# Patient Record
Sex: Female | Born: 1970 | Race: White | Hispanic: Yes | Marital: Married | Smoking: Never smoker
Health system: Southern US, Community
[De-identification: ages and names within clinical notes are randomized; demographics above are authoritative.]

## PROBLEM LIST (undated history)

## (undated) DIAGNOSIS — I499 Cardiac arrhythmia, unspecified: Secondary | ICD-10-CM

## (undated) DIAGNOSIS — I639 Cerebral infarction, unspecified: Secondary | ICD-10-CM

## (undated) DIAGNOSIS — R569 Unspecified convulsions: Secondary | ICD-10-CM

## (undated) HISTORY — DX: Cardiac arrhythmia, unspecified: I49.9

---

## 1993-12-25 HISTORY — PX: BREAST SURGERY: SHX581

## 2009-06-04 ENCOUNTER — Ambulatory Visit: Payer: Self-pay | Admitting: Diagnostic Radiology

## 2009-06-04 ENCOUNTER — Encounter: Payer: Self-pay | Admitting: Emergency Medicine

## 2009-06-04 ENCOUNTER — Inpatient Hospital Stay (HOSPITAL_COMMUNITY): Admission: EM | Admit: 2009-06-04 | Discharge: 2009-06-08 | Payer: Self-pay | Admitting: Emergency Medicine

## 2009-07-05 ENCOUNTER — Emergency Department (HOSPITAL_COMMUNITY): Admission: EM | Admit: 2009-07-05 | Discharge: 2009-07-05 | Payer: Self-pay | Admitting: Emergency Medicine

## 2010-01-17 ENCOUNTER — Emergency Department (HOSPITAL_BASED_OUTPATIENT_CLINIC_OR_DEPARTMENT_OTHER): Admission: EM | Admit: 2010-01-17 | Discharge: 2010-01-17 | Payer: Self-pay | Admitting: Emergency Medicine

## 2011-03-12 LAB — COMPREHENSIVE METABOLIC PANEL
AST: 23 U/L (ref 0–37)
Albumin: 4.3 g/dL (ref 3.5–5.2)
Alkaline Phosphatase: 86 U/L (ref 39–117)
BUN: 8 mg/dL (ref 6–23)
Chloride: 103 mEq/L (ref 96–112)
Creatinine, Ser: 0.6 mg/dL (ref 0.4–1.2)
GFR calc Af Amer: >60 mL/min (ref >60)
Potassium: 3.4 mEq/L — ABNORMAL LOW (ref 3.5–5.1)
Total Bilirubin: 0.3 mg/dL (ref 0.3–1.2)
Total Protein: 7.5 g/dL (ref 6.0–8.3)

## 2011-03-12 LAB — CBC
HCT: 34.3 % — ABNORMAL LOW (ref 36.0–46.0)
Platelets: 282 10*3/uL (ref 150–400)
RDW: 11.5 % (ref 11.5–15.5)
WBC: 7.1 10*3/uL (ref 4.0–10.5)

## 2011-03-12 LAB — URINALYSIS, ROUTINE W REFLEX MICROSCOPIC
Ketones, ur: NEGATIVE mg/dL
Protein, ur: NEGATIVE mg/dL
Urobilinogen, UA: 0.2 mg/dL (ref 0.0–1.0)

## 2011-03-12 LAB — DIFFERENTIAL
Eosinophils Relative: 1 % (ref 0–5)
Lymphocytes Relative: 20 % (ref 12–46)
Monocytes Absolute: 0.3 10*3/uL (ref 0.1–1.0)
Monocytes Relative: 4 % (ref 3–12)
Neutro Abs: 5.4 10*3/uL (ref 1.7–7.7)

## 2011-03-12 LAB — PREGNANCY, URINE: Preg Test, Ur: NEGATIVE

## 2011-04-02 LAB — URINALYSIS, ROUTINE W REFLEX MICROSCOPIC
Bilirubin Urine: NEGATIVE
Leukocytes, UA: NEGATIVE
Nitrite: NEGATIVE
Specific Gravity, Urine: 1.003 — ABNORMAL LOW (ref 1.005–1.030)
Urobilinogen, UA: 0.2 mg/dL (ref 0.0–1.0)

## 2011-04-02 LAB — COMPREHENSIVE METABOLIC PANEL
AST: 15 U/L (ref 0–37)
BUN: 6 mg/dL (ref 6–23)
CO2: 30 mEq/L (ref 19–32)
Calcium: 9.2 mg/dL (ref 8.4–10.5)
Chloride: 106 mEq/L (ref 96–112)
Creatinine, Ser: 0.69 mg/dL (ref 0.4–1.2)
GFR calc non Af Amer: >60 mL/min (ref >60)
Glucose, Bld: 102 mg/dL — ABNORMAL HIGH (ref 70–99)
Total Bilirubin: 0.3 mg/dL (ref 0.3–1.2)

## 2011-04-02 LAB — URINE MICROSCOPIC-ADD ON

## 2011-04-02 LAB — DIFFERENTIAL
Basophils Absolute: 0 10*3/uL (ref 0.0–0.1)
Eosinophils Relative: 2 % (ref 0–5)
Lymphocytes Relative: 26 % (ref 12–46)
Lymphs Abs: 1.3 10*3/uL (ref 0.7–4.0)
Neutro Abs: 3.4 10*3/uL (ref 1.7–7.7)
Neutrophils Relative %: 66 % (ref 43–77)

## 2011-04-02 LAB — CBC
HCT: 34.2 % — ABNORMAL LOW (ref 36.0–46.0)
Hemoglobin: 11.8 g/dL — ABNORMAL LOW (ref 12.0–15.0)
MCHC: 34.5 g/dL (ref 30.0–36.0)
MCV: 87.4 fL (ref 78.0–100.0)
RBC: 3.91 MIL/uL (ref 3.87–5.11)
WBC: 5.1 10*3/uL (ref 4.0–10.5)

## 2011-04-02 LAB — LIPASE, BLOOD: Lipase: 32 U/L (ref 11–59)

## 2011-04-03 LAB — CBC
HCT: 31.5 % — ABNORMAL LOW (ref 36.0–46.0)
HCT: 38 % (ref 36.0–46.0)
Hemoglobin: 10.6 g/dL — ABNORMAL LOW (ref 12.0–15.0)
Hemoglobin: 10.7 g/dL — ABNORMAL LOW (ref 12.0–15.0)
Hemoglobin: 10.7 g/dL — ABNORMAL LOW (ref 12.0–15.0)
Hemoglobin: 13.6 g/dL (ref 12.0–15.0)
MCHC: 33.8 g/dL (ref 30.0–36.0)
MCHC: 33.9 g/dL (ref 30.0–36.0)
MCHC: 34.1 g/dL (ref 30.0–36.0)
MCV: 87.8 fL (ref 78.0–100.0)
MCV: 88.7 fL (ref 78.0–100.0)
Platelets: 250 10*3/uL (ref 150–400)
Platelets: 265 10*3/uL (ref 150–400)
RBC: 3.6 MIL/uL — ABNORMAL LOW (ref 3.87–5.11)
RBC: 4.61 MIL/uL (ref 3.87–5.11)
RDW: 12.5 % (ref 11.5–15.5)
RDW: 12.5 % (ref 11.5–15.5)
RDW: 12.6 % (ref 11.5–15.5)
RDW: 12.6 % (ref 11.5–15.5)
WBC: 11.3 10*3/uL — ABNORMAL HIGH (ref 4.0–10.5)

## 2011-04-03 LAB — COMPREHENSIVE METABOLIC PANEL
ALT: 14 U/L (ref 0–35)
Alkaline Phosphatase: 72 U/L (ref 39–117)
CO2: 26 mEq/L (ref 19–32)
GFR calc non Af Amer: >60 mL/min (ref >60)
Glucose, Bld: 116 mg/dL — ABNORMAL HIGH (ref 70–99)
Potassium: 3.9 mEq/L (ref 3.5–5.1)
Sodium: 139 mEq/L (ref 135–145)
Total Bilirubin: 0.8 mg/dL (ref 0.3–1.2)

## 2011-04-03 LAB — DIFFERENTIAL
Basophils Relative: 0 % (ref 0–1)
Basophils Relative: 3 % — ABNORMAL HIGH (ref 0–1)
Eosinophils Absolute: 0 10*3/uL (ref 0.0–0.7)
Lymphocytes Relative: 20 % (ref 12–46)
Lymphs Abs: 2.3 10*3/uL (ref 0.7–4.0)
Monocytes Absolute: 0.5 10*3/uL (ref 0.1–1.0)
Monocytes Absolute: 0.7 10*3/uL (ref 0.1–1.0)
Monocytes Relative: 4 % (ref 3–12)
Neutro Abs: 8.3 10*3/uL — ABNORMAL HIGH (ref 1.7–7.7)
Neutrophils Relative %: 85 % — ABNORMAL HIGH (ref 43–77)

## 2011-04-03 LAB — URINALYSIS, ROUTINE W REFLEX MICROSCOPIC
Bilirubin Urine: NEGATIVE
Glucose, UA: NEGATIVE mg/dL
Nitrite: NEGATIVE
Specific Gravity, Urine: 1.028 (ref 1.005–1.030)
pH: 5.5 (ref 5.0–8.0)

## 2011-04-03 LAB — BASIC METABOLIC PANEL
Calcium: 8.1 mg/dL — ABNORMAL LOW (ref 8.4–10.5)
GFR calc Af Amer: >60 mL/min (ref >60)
GFR calc non Af Amer: >60 mL/min (ref >60)
Glucose, Bld: 72 mg/dL (ref 70–99)
Sodium: 138 mEq/L (ref 135–145)

## 2011-04-03 LAB — PREGNANCY, URINE: Preg Test, Ur: NEGATIVE

## 2011-04-03 LAB — AMYLASE: Amylase: 70 U/L (ref 27–131)

## 2011-04-03 LAB — LACTIC ACID, PLASMA: Lactic Acid, Venous: 0.8 mmol/L (ref 0.5–2.2)

## 2011-04-03 LAB — PROTIME-INR: Prothrombin Time: 13.2 seconds (ref 11.6–15.2)

## 2011-05-09 NOTE — Discharge Summary (Signed)
[name], [name] NO.:  [id number]   MEDICAL RECORD NO.:  [id number]          PATIENT TYPE:  INP   LOCATION:  5159                         FACILITY:  [facility]   PHYSICIAN:  Sandria Bales. Ezzard Standing, M.D.  DATE OF BIRTH:  [birth year]   DATE OF ADMISSION:  06/04/2009  DATE OF DISCHARGE:  06/08/2009                               DISCHARGE SUMMARY   ADMITTING PHYSICIAN:  Almond Lint, MD   DISCHARGING PHYSICIAN:  Sandria Bales. Ezzard Standing, MD   CONSULTANTS:  None.   PROCEDURES:  None.   REASON FOR ADMISSION:  [name] is a 40 year old white female with  no significant past medical history except for recent obstipation.  Because of her obstipation, she went to the health center the day prior  to admission and received a colonic.  After this, the patient felt faint  and immediately began having diarrhea with bright red blood per rectum.  She never had any abdominal pain, but due to continued diarrhea with  blood in it, she presented to the emergency department.  The CT scan at  this time showed portal venous gas with colonic wall thickening and a  small amount of free air along with extensive pneumatosis.  Her white  blood cell count at this time was 17,000 and on exam,  her abdomen was  soft, nontender, and nondistended with active bowel sounds, otherwise  unremarkable.  Please see admitting history and physical for further  details.   ADMITTING DIAGNOSES:  1. Bright red blood per rectum after colonic enema.  2. Pneumatosis and pneumoperitoneum per CT scan.  3. Leukocytosis.  4. Recent obstipation.   HOSPITAL COURSE:  At this time, the patient was admitted and started on  Zosyn.  She was kept under close observation with repeat films ordered  for 11 a.m. with repeat lab work.  Repeat x-rays did not show any  pneumoperitoneum and showed that the patient's portal venous gas had  dissipated.  Her white count was stable, and the patient was otherwise  nontender.  Over the  next several days, she was followed conservatively.  Her diarrhea began to improve as well as there was decrease in the  amount of blood in her diarrhea.   By hospital day 3, the patient's diet was advanced and by hospital day  4, the patient was tolerating regular diet.  However, by postoperative  day 4, the patient felt like she was not ready to go home and wanted  repeat x-rays to prove that nothing else was going on.  At this time,  her white count was normal at 8500 and a repeat KUB was obtained which  showed normal bowel gas pattern and no other issues.  By June 08, 2009,  the patient had no complaint, and at this point was ready for discharge  home.  On exam, her abdomen was soft, nontender, and nondistended with  active bowel sounds.   DISCHARGE DIAGNOSES:  1. Pneumatosis and pneumoperitoneum of the colon per CT scan after      colonic enema.  This was felt to be related to the amount of  pressure associated with this.  This had resolved by discharge.  2. Bright red blood per rectum.  This had resolved by discharge as      well.  3. Leukocytosis, resolved.  4. Obstipation.   DISCHARGE MEDICATIONS:  1. She does not take any home medications.  She was given a      prescription for Augmentin 875 mg 1 p.o. b.i.d. x5 days.  2. MiraLax or Dulcolax or any over-the-counter stool softeners as      needed for constipation.   DISCHARGE INSTRUCTIONS:  The patient is informed that she has no dietary  restrictions.  She has no activity restrictions and is to return to see her primary  care doctor as needed for followup.      Nancy Cape, PA      Sandria Bales. Ezzard Standing, M.D.  Electronically Signed    KEO/MEDQ  D:  06/08/2009  T:  06/08/2009  Job:  [id number]

## 2011-05-09 NOTE — H&P (Signed)
NAMEMAGDALENE, TARDIFF            ACCOUNT NO.:  [id number]   MEDICAL RECORD NO.:  [id number]          PATIENT TYPE:  INP   LOCATION:  5159                         FACILITY:  [facility]   PHYSICIAN:  Almond Lint, MD       DATE OF BIRTH:  1971/05/31   DATE OF ADMISSION:  06/04/2009  DATE OF DISCHARGE:                              HISTORY & PHYSICAL   TIME OF ADMISSION:  7:55 a.m.   PRIMARY CARE PHYSICIAN:  She does not have one.   ADMITTING PHYSICIAN:  Almond Lint, MD   CHIEF COMPLAINT:  Bright red blood per rectum.   HISTORY OF PRESENT ILLNESS:  Ms. Winkles is a 40 year old white female  with no significant past medical history except for recent obstipation.  She recently moved down here from [place] approximately 15 to 18 months  ago.  Due to her obstipation, she went to a health center yesterday and  received a colonic enema around 2 p.m.  As soon as this was  administered, the patient had a strange feeling and then began having  diarrhea that was stained with blood.  She states that she has never  once had abdominal pain, but she does describe multiple bouts of emesis.  Because of this bleeding and emesis, she went to [facility]  Emergency Department this morning around 1 a.m.  At this time, an NG  tube was placed and a CT scan of the abdomen and pelvis was obtained,  which showed portal venous gas along with colonic wall thickening of the  entire colon and a small amount of free air.  She also had labs done,  which showed a white blood cell count of 17,000.  At this time, she was  transferred to [facility] for admission by the surgical service.   REVIEW OF SYSTEMS:  Please see HPI, otherwise all other systems are  negative.   PAST MEDICAL HISTORY:  1. She states she has a history of an abnormal heart rhythm, but does      not remember the details and does not take any medicines for it.  2. Recent obstipation.   PAST SURGICAL HISTORY:  There is none.   FAMILY  HISTORY:  The patient admits to her grandmother and aunt having  history of breast cancer.  There are no colonic cancers or any other  colon pathology.   SOCIAL HISTORY:  The patient is married and has given birth to 1 child.  However, there are total of 4 children in the household.  She does not  smoke and does not drink any alcohol.   ALLERGIES:  NKDA.   MEDICATIONS:  None.   PHYSICAL EXAMINATION:  GENERAL:  Ms. Molchan is a 39 year old well-  developed, well-nourished white female who is currently sitting up in a  stretcher, in no acute distress.  VITAL SIGNS:  Temperature 97.6, pulse 90, respirations 19, and blood  pressure 111/65.  HEENT:  Head is normocephalic and atraumatic.  Sclerae noninjected.  Pupils are equal, round, and reactive to light.  Ears and nose without  any obvious masses or lesions.  No  rhinorrhea, however, she does have an  NG tube present.  Mouth is pink, but somewhat dry.  Throat shows no  exudate.  NECK:  Supple.  Trachea is midline.  No thyromegaly.  HEART:  Regular rate and rhythm.  Normal S1 and S2.  No murmurs,  gallops, or rubs are noted.  +2 carotid, radial, and pedal pulses  bilaterally.  LUNGS:  Clear to auscultation bilaterally with no wheezes, rhonchi, or  rales noted.  Respiratory effort is nonlabored.  ABDOMEN:  Soft, nontender, nondistended with active bowel sounds.  She  has no peritoneal signs and no masses or hernias were noted.  RECTAL:  Deferred at this time, however, the patient is stool guaiac  positive.  MUSCULOSKELETAL:  All 4 extremities are symmetrical.  No cyanosis,  clubbing, or edema.  SKIN:  Warm and dry.  NEUROLOGIC:  Cranial nerves II through XII appear to be grossly intact.  Deep tendon reflex exam is deferred at this time.  PSYCHIATRIC:  The patient is alert and oriented x3 with an appropriate  affect.   LABORATORY DATA AND DIAGNOSTICS:  White blood cell count 16,900,  hemoglobin 13.6, hematocrit 40.6, platelet  count is 295,000, and  neutrophils count are 85%.  Lactic acid level is 0.8, sodium 139,  potassium 3.9, glucose 116, BUN 8, creatinine 0.69.  CT scan of the  abdomen and pelvis stated in HPI shows some portal venous gas along with  colonic wall thickening throughout the ascending transverse and  descending colon.  However, it does seem that the sigmoid colon is  spared and decompressed.  It does shows small amount of free air, but  with significant pneumatosis.   IMPRESSION:  1. Bright red blood per rectum after colonic enema.  2. Pneumatosis and pneumoperitoneum per CT scan.  3. Leukocytosis.  4. Recent obstipation.   PLAN:  Ms. Mielke clinically does not appear as sick or toxic as her  CT scan states.  Therefore, since she is currently stable, we will admit  her and keep her for observation.  We will obtain an acute abdominal  series along with some repeat labs early this morning.  If her acute  abdominal series continues to show concern for free air, the patient may  need a diagnostic laparoscopy or laparotomy.  In the meantime, she will  be started on Zosyn as well as IV fluids at 200 mL an hour.  She will be  given various other p.r.n. medications for nausea.  She will be kept  n.p.o. and her NG tube will be held to low intermittent wall suction.      Letha Cape, PA      Almond Lint, MD  Electronically Signed   KEO/MEDQ  D:  06/04/2009  T:  06/04/2009  Job:  [id number]

## 2013-06-11 ENCOUNTER — Emergency Department (HOSPITAL_BASED_OUTPATIENT_CLINIC_OR_DEPARTMENT_OTHER)
Admission: EM | Admit: 2013-06-11 | Discharge: 2013-06-11 | Disposition: A | Discharge status: Home / Self Care | Payer: Managed Care, Other (non HMO) | Attending: Emergency Medicine | Admitting: Emergency Medicine

## 2013-06-11 ENCOUNTER — Encounter (HOSPITAL_BASED_OUTPATIENT_CLINIC_OR_DEPARTMENT_OTHER): Payer: Self-pay | Admitting: *Deleted

## 2013-06-11 DIAGNOSIS — R5381 Other malaise: Secondary | ICD-10-CM | POA: Insufficient documentation

## 2013-06-11 DIAGNOSIS — R42 Dizziness and giddiness: Secondary | ICD-10-CM | POA: Insufficient documentation

## 2013-06-11 DIAGNOSIS — R197 Diarrhea, unspecified: Secondary | ICD-10-CM | POA: Insufficient documentation

## 2013-06-11 DIAGNOSIS — K5289 Other specified noninfective gastroenteritis and colitis: Secondary | ICD-10-CM | POA: Insufficient documentation

## 2013-06-11 DIAGNOSIS — Z3202 Encounter for pregnancy test, result negative: Secondary | ICD-10-CM | POA: Insufficient documentation

## 2013-06-11 DIAGNOSIS — K529 Noninfective gastroenteritis and colitis, unspecified: Secondary | ICD-10-CM

## 2013-06-11 DIAGNOSIS — E860 Dehydration: Secondary | ICD-10-CM | POA: Insufficient documentation

## 2013-06-11 DIAGNOSIS — R6883 Chills (without fever): Secondary | ICD-10-CM | POA: Insufficient documentation

## 2013-06-11 LAB — COMPREHENSIVE METABOLIC PANEL
AST: 17 U/L (ref 0–37)
Albumin: 4.2 g/dL (ref 3.5–5.2)
BUN: 8 mg/dL (ref 6–23)
Chloride: 103 mEq/L (ref 96–112)
Creatinine, Ser: 0.7 mg/dL (ref 0.50–1.10)
Total Bilirubin: 0.5 mg/dL (ref 0.3–1.2)
Total Protein: 7.6 g/dL (ref 6.0–8.3)

## 2013-06-11 LAB — CBC WITH DIFFERENTIAL/PLATELET
Basophils Absolute: 0 10*3/uL (ref 0.0–0.1)
Basophils Relative: 0 % (ref 0–1)
Eosinophils Absolute: 0 10*3/uL (ref 0.0–0.7)
MCH: 28.7 pg (ref 26.0–34.0)
MCHC: 33.8 g/dL (ref 30.0–36.0)
Monocytes Absolute: 0.5 10*3/uL (ref 0.1–1.0)
Monocytes Relative: 7 % (ref 3–12)
Neutro Abs: 4.3 10*3/uL (ref 1.7–7.7)
Neutrophils Relative %: 62 % (ref 43–77)
RDW: 12.9 % (ref 11.5–15.5)

## 2013-06-11 LAB — URINALYSIS, ROUTINE W REFLEX MICROSCOPIC
Bilirubin Urine: NEGATIVE
Ketones, ur: NEGATIVE mg/dL
Leukocytes, UA: NEGATIVE
Nitrite: NEGATIVE
Protein, ur: NEGATIVE mg/dL

## 2013-06-11 LAB — PREGNANCY, URINE: Preg Test, Ur: NEGATIVE

## 2013-06-11 MED ORDER — KETOROLAC TROMETHAMINE 30 MG/ML IJ SOLN
30.0000 mg | Freq: Once | INTRAMUSCULAR | Status: DC
  Filled 2013-06-11: qty 1

## 2013-06-11 MED ORDER — SODIUM CHLORIDE 0.9 % IV BOLUS (SEPSIS)
1000.0000 mL | Freq: Once | INTRAVENOUS | Status: AC
  Administered 2013-06-11: 1000 mL via INTRAVENOUS

## 2013-06-11 MED ORDER — ONDANSETRON HCL 4 MG/2ML IJ SOLN
4.0000 mg | Freq: Once | INTRAMUSCULAR | Status: DC
  Filled 2013-06-11: qty 2

## 2013-06-11 NOTE — ED Notes (Signed)
Was on treadmill at the gym and after 50 minutes got chills and started having diarrhea and nausea

## 2013-06-11 NOTE — ED Provider Notes (Signed)
History     CSN: [id number]  Arrival date & time 06/11/13  1148   First MD Initiated Contact with Patient 06/11/13 1222      Chief Complaint  Patient presents with  . possible dehydration     (Consider location/radiation/quality/duration/timing/severity/associated sxs/prior treatment) HPI Comments: Patient was running on the treadmill yesterday.  When she got off the equipment, she started with n/v/d that has persisted into today.  She feels weak and dizzy and believes she is dehydrated.    Patient is a 42 y.o. female presenting with diarrhea. The history is provided by the patient.  Diarrhea Quality:  Watery Severity:  Moderate Onset quality:  Sudden Duration:  24 hours Timing:  Constant Progression:  Unchanged Relieved by:  Nothing Worsened by:  Nothing tried Ineffective treatments:  None tried Associated symptoms: chills and vomiting   Associated symptoms: no abdominal pain and no fever     History reviewed. No pertinent past medical history.  History reviewed. No pertinent past surgical history.  History reviewed. No pertinent family history.  History  Substance Use Topics  . Smoking status: Never Smoker   . Smokeless tobacco: Not on file  . Alcohol Use: No    OB History   Grav Para Term Preterm Abortions TAB SAB Ect Mult Living                  Review of Systems  Constitutional: Positive for chills and fatigue. Negative for fever.  Gastrointestinal: Positive for vomiting and diarrhea. Negative for abdominal pain.  All other systems reviewed and are negative.    Allergies  Review of patient's allergies indicates no known allergies.  Home Medications  No current outpatient prescriptions on file.  LMP 05/21/2013  Physical Exam  Nursing note and vitals reviewed. Constitutional: She is oriented to person, place, and time. She appears well-developed and well-nourished. No distress.  HENT:  Head: Normocephalic and atraumatic.  Mouth/Throat:  Oropharynx is clear and moist.  Neck: Normal range of motion. Neck supple.  Cardiovascular: Normal rate and regular rhythm.  Exam reveals no gallop and no friction rub.   No murmur heard. Pulmonary/Chest: Effort normal and breath sounds normal. No respiratory distress. She has no wheezes.  Abdominal: Soft. Bowel sounds are normal. She exhibits no distension. There is no tenderness.  Musculoskeletal: Normal range of motion.  Neurological: She is alert and oriented to person, place, and time.  Skin: Skin is warm and dry. She is not diaphoretic.    ED Course  Procedures (including critical care time)  Labs Reviewed  PREGNANCY, URINE  URINALYSIS, ROUTINE W REFLEX MICROSCOPIC  CBC WITH DIFFERENTIAL  COMPREHENSIVE METABOLIC PANEL   No results found.   No diagnosis found.    MDM  Fluids given and she feels better.  Labs okay.  Will discharge to home, return prn.        Geoffery Lyons, MD 06/11/13 [phone]

## 2013-06-11 NOTE — ED Notes (Signed)
Pt declined Zofran and Toradol.

## 2014-11-10 ENCOUNTER — Encounter (HOSPITAL_BASED_OUTPATIENT_CLINIC_OR_DEPARTMENT_OTHER): Payer: Self-pay | Admitting: Emergency Medicine

## 2014-11-10 DIAGNOSIS — R197 Diarrhea, unspecified: Secondary | ICD-10-CM | POA: Diagnosis not present

## 2014-11-10 DIAGNOSIS — R112 Nausea with vomiting, unspecified: Secondary | ICD-10-CM | POA: Insufficient documentation

## 2014-11-10 DIAGNOSIS — Z3202 Encounter for pregnancy test, result negative: Secondary | ICD-10-CM | POA: Diagnosis not present

## 2014-11-10 NOTE — ED Notes (Signed)
Pt states she took a B12 pill tonight, her first time taking it, and started vomiting after.

## 2014-11-11 ENCOUNTER — Emergency Department (HOSPITAL_BASED_OUTPATIENT_CLINIC_OR_DEPARTMENT_OTHER)
Admission: EM | Admit: 2014-11-11 | Discharge: 2014-11-11 | Disposition: A | Discharge status: Home / Self Care | Payer: Managed Care, Other (non HMO) | Attending: Emergency Medicine | Admitting: Emergency Medicine

## 2014-11-11 DIAGNOSIS — R112 Nausea with vomiting, unspecified: Secondary | ICD-10-CM

## 2014-11-11 DIAGNOSIS — R197 Diarrhea, unspecified: Secondary | ICD-10-CM

## 2014-11-11 LAB — URINE MICROSCOPIC-ADD ON

## 2014-11-11 LAB — PREGNANCY, URINE: Preg Test, Ur: NEGATIVE

## 2014-11-11 LAB — URINALYSIS, ROUTINE W REFLEX MICROSCOPIC
BILIRUBIN URINE: NEGATIVE
Glucose, UA: NEGATIVE mg/dL
KETONES UR: NEGATIVE mg/dL
Leukocytes, UA: NEGATIVE
NITRITE: NEGATIVE
PROTEIN: NEGATIVE mg/dL
Specific Gravity, Urine: 1.028 (ref 1.005–1.030)
UROBILINOGEN UA: 0.2 mg/dL (ref 0.0–1.0)
pH: 5.5 (ref 5.0–8.0)

## 2014-11-11 MED ORDER — DIPHENOXYLATE-ATROPINE 2.5-0.025 MG PO TABS: 1.0000 | ORAL_TABLET | Freq: Four times a day (QID) | ORAL | Status: DC | PRN

## 2014-11-11 MED ORDER — DIPHENOXYLATE-ATROPINE 2.5-0.025 MG PO TABS
2.0000 | ORAL_TABLET | Freq: Once | ORAL | Status: AC
Start: 2014-11-11 — End: 2014-11-11
  Administered 2014-11-11: 2 via ORAL
  Filled 2014-11-11: qty 2

## 2014-11-11 MED ORDER — ONDANSETRON 4 MG PO TBDP
4.0000 mg | ORAL_TABLET | Freq: Once | ORAL | Status: AC
  Administered 2014-11-11: 4 mg via ORAL
  Filled 2014-11-11: qty 1

## 2014-11-11 MED ORDER — SODIUM CHLORIDE 0.9 % IV BOLUS (SEPSIS)
1000.0000 mL | Freq: Once | INTRAVENOUS | Status: AC
  Administered 2014-11-11: 1000 mL via INTRAVENOUS

## 2014-11-11 MED ORDER — ONDANSETRON HCL 4 MG PO TABS: 4.0000 mg | ORAL_TABLET | Freq: Three times a day (TID) | ORAL | Status: DC | PRN

## 2014-11-11 NOTE — ED Provider Notes (Signed)
CSN: [id number]     Arrival date & time 11/10/14  2320 History   First MD Initiated Contact with Patient 11/11/14 0108     Chief Complaint  Patient presents with  . Vomiting      (Consider location/radiation/quality/duration/timing/severity/associated sxs/prior Treatment) HPI This is a 43 year old female who developed nausea, vomiting and diarrhea yesterday afternoon. The onset was sudden. The vomiting was profuse but later improved. She continues to have profuse watery diarrhea. She denies abdominal pain or fever. She denies lightheadedness. She was given Zofran on arrival with improvement in her nausea.  History reviewed. No pertinent past medical history. Past Surgical History  Procedure Laterality Date  . Breast surgery     History reviewed. No pertinent family history. History  Substance Use Topics  . Smoking status: Never Smoker   . Smokeless tobacco: Not on file  . Alcohol Use: No   OB History    No data available     Review of Systems  All other systems reviewed and are negative.   Allergies  Review of patient's allergies indicates no known allergies.  Home Medications   Prior to Admission medications   Medication Sig Start Date End Date Taking? Authorizing Provider  Vitamin D, Ergocalciferol, (DRISDOL) 50000 UNITS CAPS capsule Take 50,000 Units by mouth every 7 (seven) days.   Yes Historical Provider, MD   BP 120/68 mmHg  Pulse 103  Temp(Src) 99.1 F (37.3 C) (Oral)  Resp 18  Ht 5\' 4"  (1.626 m)  Wt 132 lb (59.875 kg)  BMI 22.65 kg/m2  SpO2 97%  LMP 11/08/2014   Physical Exam  General: Well-developed, well-nourished female in no acute distress; appearance consistent with age of record HENT: normocephalic; atraumatic Eyes: pupils equal, round and reactive to light; extraocular muscles intact Neck: supple Heart: regular rate and rhythm; tachycardia Lungs: clear to auscultation bilaterally Abdomen: soft; nondistended; nontender; no masses or  hepatosplenomegaly; bowel sounds present Extremities: No deformity; full range of motion; pulses normal Neurologic: Awake, alert and oriented; motor function intact in all extremities and symmetric; no facial droop Skin: Warm and dry Psychiatric: Normal mood and affect    ED Course  Procedures (including critical care time)    MDM  Nursing notes and vitals signs, including pulse oximetry, reviewed.  Summary of this visit's results, reviewed by myself:  Labs:  Results for orders placed or performed during the hospital encounter of 11/11/14 (from the past 24 hour(s))  Urinalysis, Routine w reflex microscopic     Status: Abnormal   Collection Time: 11/11/14  1:40 AM  Result Value Ref Range   Color, Urine YELLOW YELLOW   APPearance CLEAR CLEAR   Specific Gravity, Urine 1.028 1.005 - 1.030   pH 5.5 5.0 - 8.0   Glucose, UA NEGATIVE NEGATIVE mg/dL   Hgb urine dipstick TRACE (A) NEGATIVE   Bilirubin Urine NEGATIVE NEGATIVE   Ketones, ur NEGATIVE NEGATIVE mg/dL   Protein, ur NEGATIVE NEGATIVE mg/dL   Urobilinogen, UA 0.2 0.0 - 1.0 mg/dL   Nitrite NEGATIVE NEGATIVE   Leukocytes, UA NEGATIVE NEGATIVE  Pregnancy, urine     Status: None   Collection Time: 11/11/14  1:40 AM  Result Value Ref Range   Preg Test, Ur NEGATIVE NEGATIVE  Urine microscopic-add on     Status: None   Collection Time: 11/11/14  1:40 AM  Result Value Ref Range   Squamous Epithelial / LPF RARE RARE   WBC, UA 0-2 <3 WBC/hpf   RBC / HPF 0-2 <3 RBC/hpf  Bacteria, UA RARE RARE   Urine-Other MUCOUS PRESENT    2:38 AM Drinking fluids without emesis.   Hanley SeamenJohn L Elayjah Chaney, MD 11/11/14 [phone]

## 2016-10-24 ENCOUNTER — Encounter (HOSPITAL_BASED_OUTPATIENT_CLINIC_OR_DEPARTMENT_OTHER): Payer: Self-pay

## 2016-10-24 ENCOUNTER — Emergency Department (HOSPITAL_BASED_OUTPATIENT_CLINIC_OR_DEPARTMENT_OTHER)
Admission: EM | Admit: 2016-10-24 | Discharge: 2016-10-24 | Disposition: A | Discharge status: Home / Self Care | Payer: Managed Care, Other (non HMO) | Attending: Physician Assistant | Admitting: Physician Assistant

## 2016-10-24 ENCOUNTER — Emergency Department (HOSPITAL_BASED_OUTPATIENT_CLINIC_OR_DEPARTMENT_OTHER): Payer: Managed Care, Other (non HMO)

## 2016-10-24 DIAGNOSIS — R05 Cough: Secondary | ICD-10-CM | POA: Insufficient documentation

## 2016-10-24 DIAGNOSIS — J3489 Other specified disorders of nose and nasal sinuses: Secondary | ICD-10-CM | POA: Insufficient documentation

## 2016-10-24 DIAGNOSIS — R0981 Nasal congestion: Secondary | ICD-10-CM | POA: Insufficient documentation

## 2016-10-24 DIAGNOSIS — R52 Pain, unspecified: Secondary | ICD-10-CM | POA: Diagnosis present

## 2016-10-24 DIAGNOSIS — R5383 Other fatigue: Secondary | ICD-10-CM | POA: Diagnosis not present

## 2016-10-24 DIAGNOSIS — R6889 Other general symptoms and signs: Secondary | ICD-10-CM

## 2016-10-24 DIAGNOSIS — M791 Myalgia: Secondary | ICD-10-CM | POA: Diagnosis not present

## 2016-10-24 MED ORDER — BENZONATATE 100 MG PO CAPS: 100.0000 mg | ORAL_CAPSULE | Freq: Three times a day (TID) | ORAL | 0 refills | Status: DC | PRN

## 2016-10-24 MED ORDER — ALBUTEROL SULFATE HFA 108 (90 BASE) MCG/ACT IN AERS
2.0000 | INHALATION_SPRAY | Freq: Once | RESPIRATORY_TRACT | Status: AC
  Administered 2016-10-24: 2 via RESPIRATORY_TRACT
  Filled 2016-10-24: qty 6.7

## 2016-10-24 MED ORDER — GUAIFENESIN-CODEINE 100-10 MG/5ML PO SOLN: 5.0000 mL | Freq: Four times a day (QID) | ORAL | 0 refills | Status: DC | PRN

## 2016-10-24 MED ORDER — IBUPROFEN 800 MG PO TABS: 800.0000 mg | ORAL_TABLET | Freq: Three times a day (TID) | ORAL | 0 refills | Status: DC

## 2016-10-24 MED ORDER — IBUPROFEN 800 MG PO TABS
800.0000 mg | ORAL_TABLET | Freq: Once | ORAL | Status: DC
  Filled 2016-10-24: qty 1

## 2016-10-24 MED ORDER — GUAIFENESIN 100 MG/5ML PO SOLN
5.0000 mL | Freq: Once | ORAL | Status: AC
  Administered 2016-10-24: 100 mg via ORAL
  Filled 2016-10-24: qty 5

## 2016-10-24 NOTE — ED Provider Notes (Signed)
[facility] Note   CSN: [id number] Arrival date & time: 10/24/16  1527     History   Chief Complaint Chief Complaint  Patient presents with  . Generalized Body Aches    HPI [name] is a 45 y.o. female.   URI   This is a new problem. The current episode started 2 days ago. The problem has been gradually worsening. There has been no fever. The fever has been present for less than 1 day. Associated symptoms include congestion, rhinorrhea and cough. Pertinent negatives include no chest pain, no abdominal pain, no sneezing, no sore throat and no wheezing. She has tried increased fluids and other medications for the symptoms. The treatment provided no relief.    History reviewed. No pertinent past medical history.  There are no active problems to display for this patient.   Past Surgical History:  Procedure Laterality Date  . BREAST SURGERY      OB History    No data available       Home Medications    Prior to Admission medications   Medication Sig Start Date End Date Taking? Authorizing Provider  Vitamin D, Ergocalciferol, (DRISDOL) 50000 UNITS CAPS capsule Take 50,000 Units by mouth every 7 (seven) days.    Historical Provider, MD    Family History No family history on file.  Social History Social History  Substance Use Topics  . Smoking status: Never Smoker  . Smokeless tobacco: Never Used  . Alcohol use No     Allergies   Review of patient's allergies indicates no known allergies.   Review of Systems Review of Systems  Constitutional: Positive for activity change and fatigue.  HENT: Positive for congestion and rhinorrhea. Negative for sneezing and sore throat.   Respiratory: Positive for cough. Negative for wheezing.   Cardiovascular: Negative for chest pain.  Gastrointestinal: Negative for abdominal pain.  Musculoskeletal: Positive for myalgias.  All other systems reviewed and are negative.    Physical  Exam Updated Vital Signs BP 130/75 (BP Location: Left Arm)   Pulse 96   Temp 97.6 F (36.4 C) (Oral)   Resp 18   Ht 5\' 4"  (1.626 m)   Wt 143 lb (64.9 kg)   LMP 10/23/2016   SpO2 100%   BMI 24.55 kg/m   Physical Exam  Constitutional: She is oriented to person, place, and time. She appears well-developed and well-nourished.  HENT:  Head: Normocephalic and atraumatic.  Right Ear: External ear normal.  Left Ear: External ear normal.  Mouth/Throat: Oropharynx is clear and moist. No oropharyngeal exudate.  Eyes: Conjunctivae and EOM are normal. Pupils are equal, round, and reactive to light. Right eye exhibits no discharge. Left eye exhibits no discharge.  Cardiovascular: Normal rate, regular rhythm and normal heart sounds.   No murmur heard. Pulmonary/Chest: Effort normal and breath sounds normal. She has no wheezes. She has no rales.  Abdominal: Soft. She exhibits no distension. There is no tenderness.  Neurological: She is oriented to person, place, and time.  Skin: Skin is warm and dry. She is not diaphoretic.  Psychiatric: She has a normal mood and affect.  Nursing note and vitals reviewed.    ED Treatments / Results  Labs (all labs ordered are listed, but only abnormal results are displayed) Labs Reviewed - No data to display  EKG  EKG Interpretation None       Radiology Dg Chest 2 View  Result Date: 10/24/2016 CLINICAL DATA:  Cough and congestion for  4 days EXAM: CHEST  2 VIEW COMPARISON:  09/25/2013 FINDINGS: Cardiac shadow is within normal limits. The lungs are well aerated bilaterally. No focal infiltrate or sizable effusion is seen. No acute bony abnormality is noted. IMPRESSION: No active cardiopulmonary disease. Electronically Signed   By: Alcide CleverMark  Lukens M.D.   On: 10/24/2016 16:00    Procedures Procedures (including critical care time)  Medications Ordered in ED Medications - No data to display   Initial Impression / Assessment and Plan / ED Course  I  have reviewed the triage vital signs and the nursing notes.  Pertinent labs & imaging results that were available during my care of the patient were reviewed by me and considered in my medical decision making (see chart for details).  Clinical Course    Patient is a 45 year old female presenting with upper respiratory symptoms. This started on Friday she had body aches, fatigue, subjective fevers with a runny nose and cough. Patient reports the cough is getting worse over the last 3 days. The body aches which resolved however the cough has worsened. Patient reported shortness of breath prior to arrival. Patient still has rhinorrhea.   We'll get chest x-ray. We'll treat symptomatically.   Final Clinical Impressions(s) / ED Diagnoses   Final diagnoses:  None    New Prescriptions New Prescriptions   No medications on file     Courteney Randall AnLyn Mackuen, MD 10/24/16 2256

## 2016-10-24 NOTE — ED Triage Notes (Addendum)
C/o body aches, cough, head and chest congestion "feel like a have the flu" since Friday-c/o SOB started 30 min PTA-NAD-steady gait-no flu shot

## 2016-10-24 NOTE — ED Notes (Signed)
Patient is alert and oriented x4.  She is complaining of generalized body aches with a productive cough of white sputum.  Patient adds that she feels chest tightness with congestion.

## 2016-10-24 NOTE — ED Notes (Signed)
Patient is alert and oriented x3.  She was given DC instructions and follow up visit instructions.  Patient gave verbal understanding. She was DC ambulatory under her own power to home.  V/S stable.  He was not showing any signs of distress on DC 

## 2016-10-24 NOTE — Discharge Instructions (Signed)
Please use medications provided to help you with your symptoms. Please return with any concerns.

## 2021-07-24 DIAGNOSIS — I639 Cerebral infarction, unspecified: Secondary | ICD-10-CM

## 2021-07-24 HISTORY — DX: Cerebral infarction, unspecified: I63.9

## 2021-08-02 NOTE — Progress Notes (Signed)
 [facility]  Neurology [address] [place], [place] [address] Ph: [phone]  Fax: [phone]   [name]  05/07/1971  08/02/2021  Chief Complaint: TIA       HPI:  [name] is a 50 y.o. year old right-handed female with PMH of:  - TIA, on ASA 81 mg daily & Plavix 75 mg daily - HLD, on Lipitor 40 mg daily - Palpitations - Vitamin D  deficiency - Iron deficiency anemia  Patient presents to clinic today (08/02/2021) for evaluation of TIA.   [name] was at her normal neurological baseline until 07/23/2021. She states that she was driving back from vacation in [place] , when she experienced sudden right arm and leg tingling followed by right face tingling and slurred speech. She pulled into a rest stop and called EMS. She was taken to [facility], in [place], [place] . She states that she had several scans, (CT head x 4, and MRI brain x 1), and she was told that there was no evidence of infarct. She was also found to have COVID infection. She was treated with tPA. She was started on ASA, Plavix, and Lipitor and discharged with plan to follow up with Neurology as an outpatient.   She states that since discharge, she has had a feeling of pressure in the head without headache. She has some pain in the left eye lasting a few seconds, 2-3 times per week. She denies persistent changes in vision. She endorses mild right arm and leg weakness with paresthesia. She has occasional tingling that may occur in the left and right ear, left foot, and BL hands.   Prior imaging: None available for review  Review of Systems  Constitutional: Positive for fatigue. Negative for chills and fever.  HENT: Negative for tinnitus and trouble swallowing.   Eyes: Positive for pain. Negative for visual disturbance.  Respiratory: Negative for cough and shortness of breath.   Cardiovascular: Positive for palpitations. Negative for  chest pain.  Gastrointestinal: Positive for nausea. Negative for diarrhea and vomiting.  Genitourinary: Negative for difficulty urinating and hematuria.  Musculoskeletal: Positive for back pain and neck pain.  Skin: Negative for rash.  Neurological: Positive for dizziness, weakness and numbness. Negative for speech difficulty.  Psychiatric/Behavioral: Negative for dysphoric mood. The patient is not nervous/anxious.    A complete ROS was performed with positive/negative pertinent findings listed above and in the HPI. All other systems are negative.   Allergies  Allergen Reactions  . Augmentin [Amoxicillin-Pot Clavulanate] Other (See Comments)    Tingling  . Ceftin [Cefuroxime Axetil] Other (See Comments)    Tingling  . Levaquin [Levofloxacin] Diarrhea   Current Outpatient Medications  Medication Sig  . aspirin  81 MG chewable tablet *ANTIPLATELET* Chew 81 mg by mouth daily.  . atorvastatin  (LIPITOR) 40 MG tablet 40 MG BY MOUTH AT BEDTIME  . clopidogreL (PLAVIX) 75 mg tablet  *ANTIPLATELET* 75 MG BY MOUTH DAILY  . ferrous sulfate (IRON ORAL) Take by mouth.  . ondansetron  (ZOFRAN ) 4 MG tablet Take 1 tablet (4 mg total) by mouth every 6 (six) hours as needed for Nausea / Vomiting.   Past Medical History:  Diagnosis Date  . Allergy    Past Surgical History:  Procedure Laterality Date  . BREAST BIOPSY Left    in her 30's  . BREAST EXCISIONAL BIOPSY Right    age 40, benign  . BREAST EXCISIONAL BIOPSY Right    age 89's, benign  .  CYST REMOVAL     Family History  Problem Relation Age of Onset  . Breast cancer Paternal Grandmother 43  . Allergies Mother   . Allergies Father   . Allergies Sister   . Glaucoma Sister   . Glaucoma Paternal Aunt   . Breast cancer Paternal Aunt 45  . Macular degeneration Neg Hx    Social History    Tobacco History    Smoking Status Never Smoker   Smokeless Tobacco Use Never Used       Alcohol History    Alcohol Use Status No       Drug  Use    Drug Use Status No         BP 132/83   Pulse 98   Temp 98.3 F (36.8 C)   Resp 16   Wt 68.4 kg (150 lb 12.8 oz)   BMI 25.88 kg/m   General Examination: General Appearance:  In no apparent distress.  Eyes: sclera anicteric HENT:  Normocephalic atraumatic, mucous membranes moist.   Lungs: Breathing comfortably on room air Skin:  Warm, dry, no notable rashes or lesions on visible surfaces. Extremities:  No clubbing, cyanosis, edema. Psych: No significant anxiety or emotional lability.  Pt is cooperative and appropriate.   Neurological Exam: Cognition:   Alert, oriented x3; speech is fluent and comprehension is intact.  No significant issues with memory, cognition, or attention apparent. Cranial Nerves:  Pupils equal, round and reactive to light. Fundoscopic exam is unremarkable. Extraocular movements are full, no nystagmus. Visual fields are full.  Facial strength symmetric.  Facial sensation normal / symmetric.  Tongue and palate midline.  Voice non-dysarthric.  Hearing intact to finger rub. Sternocleidomastoid and trapezius strength are normal. Motor:  Bulk: normal; Tone: normal.  No Pronator drift.  No abnormal or involuntary movements.    Strength:   Right Left     Right Left  Deltoid 5 5   Hip Flexion  5 5   Biceps 5 5    Knee Extension  5 5   Triceps 5 5    Knee Flexion  5 5   Handgrip 5  5             Deep Tendon Reflexes:     Right Left  Biceps   2  2   Brachioradialis  2 2   Quadriceps  2  2   Ankle jerk 2 2  Babinski  Down going  Down going     Sensation:  Intact and symmetric to light touch and temperature throughout.  No significant sway with Romberg's testing  Coordination:  Finger to nose testing normal bilaterally . Gait:  Normal base, stride length, and arm swing. Normal heel and toe walking. Normal tandem gait.    Relevant Data  Reviewed today? Study/Test Report Summary  Yes  Referral/PCP notes None available for review   Most recent brain  imaging None available for review   Relevant cardiac testing None available for review  Yes Labs Component     Latest Ref Rng & Units 07/08/2021  WBC     4.4 - 11.0 x 10*3/uL 6.0  HEMOGLOBIN     12.3 - 15.3 G/DL 87.7 (L)  MCV     19.9 - 96.0 FL 84.7  PLATELET COUNT     150 - 450 X 10*3/uL 353   Component     Latest Ref Rng & Units 07/08/2021  SODIUM     135 - 146 MMOL/L 140  POTASSIUM     3.5 - 5.3 MMOL/L 3.9  GLUCOSE     70 - 99 MG/DL 90  CREATININE     9.49 - 1.50 MG/DL 9.27  CALCIUM      8.5 - 10.5 MG/DL 9.1  AST     5 - 40 IU/L or U/L 15  ALT     5 - 50 IU/L or U/L 11   Component     Latest Ref Rng & Units 11/15/2020  LDL     0 - 99 MG/DL 892 (H)   Component     Latest Ref Rng & Units 08/03/2020 12/10/2020  Vit D, 25-Hydroxy     >=30 ng/mL 48   VITAMIN B12     200 - 900 pg/mL  492    Component     Latest Ref Rng & Units 11/15/2020 07/08/2021  TSH     0.45 - 5.33 UIU/ML 1.378 0.83         Assessment: [name] is a 50 y.o. year old right-handed female with PMH of TIA, on ASA & Plavix, HLD, on Lipitor, palpitations, vitamin D  deficiency, and iron deficiency anemia who presents to clinic today (08/02/2021) for TIA on 07/23/2021. Neurological examination essentially unremarkable.  History of present illness, review of systems, physical examination, and review of relevant data indicates possible TIA. Plan of care is as follows:  Plan: Continue ASA 81 mg daily Continue Plavix 75 mg daily for 21 days, (stop 08/13/2021) Continue Lipitor 40 mg daily Request images and reports from workup done at [facility], in [place], [place], including MRI brain, MRA or CTA head & neck, TTE with bubble study, as well as A1C & LDL. Zio patch x 15 days to evaluate for Afib Follow up with Cardiology for palpitations, as scheduled for 08/02/2021 Follow up in about 3 months. Instructed patient to call with any questions or concerns.  All of the patient's questions were  answered, and they verbalized understanding.   I have personally spent 60 minutes involved in face-to-face and non-face-to-face activities for this patient on the day of the visit.  Professional time spent includes the following activities, in addition to those noted in the documentation: Chart review, face-to-face counseling, and coordinating care    Electronically signed by: Alfonso Nat Boone, MD   This document was created using the aid of voice recognition Dragon dictation software.     Electronically signed by: Alfonso Nat Boone, MD 08/02/21 [phone]

## 2021-08-06 ENCOUNTER — Emergency Department (HOSPITAL_BASED_OUTPATIENT_CLINIC_OR_DEPARTMENT_OTHER)
Admission: EM | Admit: 2021-08-06 | Discharge: 2021-08-06 | Disposition: A | Discharge status: Home / Self Care | Payer: Commercial Managed Care - PPO | Attending: Emergency Medicine | Admitting: Emergency Medicine

## 2021-08-06 ENCOUNTER — Emergency Department (HOSPITAL_BASED_OUTPATIENT_CLINIC_OR_DEPARTMENT_OTHER): Payer: Commercial Managed Care - PPO

## 2021-08-06 ENCOUNTER — Other Ambulatory Visit: Payer: Self-pay

## 2021-08-06 ENCOUNTER — Encounter (HOSPITAL_BASED_OUTPATIENT_CLINIC_OR_DEPARTMENT_OTHER): Payer: Self-pay | Admitting: Emergency Medicine

## 2021-08-06 DIAGNOSIS — M79604 Pain in right leg: Secondary | ICD-10-CM | POA: Insufficient documentation

## 2021-08-06 DIAGNOSIS — J019 Acute sinusitis, unspecified: Secondary | ICD-10-CM | POA: Diagnosis not present

## 2021-08-06 DIAGNOSIS — M25561 Pain in right knee: Secondary | ICD-10-CM | POA: Diagnosis not present

## 2021-08-06 HISTORY — DX: Unspecified convulsions: R56.9

## 2021-08-06 HISTORY — DX: Cerebral infarction, unspecified: I63.9

## 2021-08-06 MED ORDER — FLUTICASONE PROPIONATE 50 MCG/ACT NA SUSP: 2.0000 | Freq: Every day | NASAL | 0 refills | Status: DC

## 2021-08-06 NOTE — Discharge Instructions (Addendum)
Continue taking home medication as prescribed. Use ice behind your knee to help with pain and swelling. Follow-up with her primary care doctor as needed for further evaluation of your symptoms. Use the nasal spray to help with sinus pain and congestion. Take Tylenol as needed for pain or pressure. Return to the emergency room with any new, worsening, concerning symptoms

## 2021-08-06 NOTE — ED Provider Notes (Signed)
[facility] EMERGENCY DEPARTMENT Provider Note   CSN: [id number] Arrival date & time: 08/06/21  1408     History Chief Complaint  Patient presents with   Leg Swelling    [name] is a 50 y.o. female presenting for evaluation of right leg pain and sinus pressure.  Patient states that today she developed right knee pain and a bump on the posterior aspect of her right knee.  She was diagnosed with COVID 2 weeks ago and also had an acute stroke 2 weeks ago.  She is on Plavix and aspirin.  She has had recent long travel.  No surgeries or immobilization.  No previous blood clot or DVT.  She is very anxious about a possible DVT. Additionally, patient reports 2 weeks of sinus pressure and congestion, both in the frontal and maxillary areas.  No ear pain.  No fevers or chills.  No cough, chest pain, shortness of breath, nausea, vomiting, abdominal pain  HPI     Past Medical History:  Diagnosis Date   Seizures (HCC)    Stroke (HCC)     There are no problems to display for this patient.   Past Surgical History:  Procedure Laterality Date   BREAST SURGERY       OB History   No obstetric history on file.     History reviewed. No pertinent family history.  Social History   Tobacco Use   Smoking status: Never   Smokeless tobacco: Never  Substance Use Topics   Alcohol use: No   Drug use: No    Home Medications Prior to Admission medications   Medication Sig Start Date End Date Taking? Authorizing Provider  fluticasone (FLONASE) 50 MCG/ACT nasal spray Place 2 sprays into both nostrils daily. 08/06/21  Yes Jillaine Waren, PA-C  benzonatate (TESSALON PERLES) 100 MG capsule Take 1 capsule (100 mg total) by mouth 3 (three) times daily as needed for cough. 10/24/16   Mackuen, Courteney Lyn, MD  guaiFENesin-codeine 100-10 MG/5ML syrup Take 5 mLs by mouth every 6 (six) hours as needed for cough. 10/24/16   Mackuen, Courteney Lyn, MD  ibuprofen (ADVIL,MOTRIN)  800 MG tablet Take 1 tablet (800 mg total) by mouth 3 (three) times daily. 10/24/16   Mackuen, Courteney Lyn, MD  Vitamin D, Ergocalciferol, (DRISDOL) 50000 UNITS CAPS capsule Take 50,000 Units by mouth every 7 (seven) days.    [provider]    Allergies    Amoxicillin-pot clavulanate, Levofloxacin, and Cefuroxime axetil  Review of Systems   Review of Systems  Constitutional:  Negative for fever.  HENT:  Positive for congestion, sinus pressure and sinus pain.   Musculoskeletal:  Positive for arthralgias and myalgias.  Hematological:  Bruises/bleeds easily.   Physical Exam Updated Vital Signs BP 122/79 (BP Location: Right Arm)   Pulse 88   Temp 98.7 F (37.1 C) (Oral)   Resp 15   Ht 5\' 4"  (1.626 m)   Wt 67.1 kg   LMP 08/02/2021   SpO2 100%   BMI 25.40 kg/m   Physical Exam Vitals and nursing note reviewed.  Constitutional:      General: She is not in acute distress.    Appearance: She is well-developed.     Comments: Resting in the bed in NAD  HENT:     Head: Normocephalic and atraumatic.     Ears:     Comments: TMs nonerythematous nonbulging bilaterally.    Nose: Mucosal edema present.     Comments: Very mild mucosal  edema.  No significant rhinorrhea.    Mouth/Throat:     Comments: OP clear without tonsillar swelling or exudate. Eyes:     Extraocular Movements: Extraocular movements intact.  Cardiovascular:     Rate and Rhythm: Normal rate and regular rhythm.     Pulses: Normal pulses.  Pulmonary:     Effort: Pulmonary effort is normal.     Breath sounds: Normal breath sounds.  Abdominal:     General: There is no distension.  Musculoskeletal:     Cervical back: Normal range of motion.     Comments: No obvious swelling of the right leg when compared to the left.  Mild tenderness palpation of the posterior right knee, no palpable lesion on my evaluation.  No erythema or warmth.  Full active range of motion of the knee without difficulty.  No tenderness  palpation of the calf, negative Homans.  Skin:    General: Skin is warm.     Findings: No rash.  Neurological:     Mental Status: She is alert and oriented to person, place, and time.    ED Results / Procedures / Treatments   Labs (all labs ordered are listed, but only abnormal results are displayed) Labs Reviewed - No data to display  EKG None  Radiology US Venous Img Lower Unilateral Right  Result Date: 08/06/2021 CLINICAL DATA:  Right posterior knee swelling for 2 days. EXAM: RIGHT LOWER EXTREMITY VENOUS DOPPLER ULTRASOUND TECHNIQUE: Gray-scale sonography with compression, as well as color and duplex ultrasound, were performed to evaluate the deep venous system(s) from the level of the common femoral vein through the popliteal and proximal calf veins. COMPARISON:  None. FINDINGS: VENOUS Normal compressibility of the common femoral, superficial femoral, and popliteal veins, as well as the visualized calf veins. Visualized portions of profunda femoral vein and great saphenous vein unremarkable. No filling defects to suggest DVT on grayscale or color Doppler imaging. Doppler waveforms show normal direction of venous flow, normal respiratory plasticity and response to augmentation. Limited views of the contralateral common femoral vein are unremarkable. OTHER None. Limitations: none IMPRESSION: Negative. Electronically Signed   By: Romona Curls M.D.   On: 08/06/2021 16:06    Procedures Procedures   Medications Ordered in ED Medications - No data to display  ED Course  I have reviewed the triage vital signs and the nursing notes.  Pertinent labs & imaging results that were available during my care of the patient were reviewed by me and considered in my medical decision making (see chart for details).    MDM Rules/Calculators/A&P                           Patient resenting for evaluation of right leg/knee pain and sinus pressure.  On exam, patient appears nontoxic.  ENT exam is  most consistent with sinusitis, will treat with Flonase.  Will obtain ultrasound of the right leg to rule out DVT, although my suspicion for DVT is low.   Ultrasound negative for DVT.  Discussed findings with patient.  Discussed follow-up with PCP as needed.  At this time, patient was safe discharge.  Return precautions given.  Patient states he understands and agrees to plan.  Final Clinical Impression(s) / ED Diagnoses Final diagnoses:  Right leg pain  Acute sinusitis, recurrence not specified, unspecified location    Rx / DC Orders ED Discharge Orders          Ordered  fluticasone (FLONASE) 50 MCG/ACT nasal spray  Daily        08/06/21 1634             Alveria Apley, PA-C 08/06/21 1648    Long, Arlyss Repress, MD 08/07/21 [phone]

## 2021-08-06 NOTE — ED Triage Notes (Signed)
Pt arrives pov with c/o lump on posterior R knee. Pt denies pain, endorses recent stroke 2 weeks pta. Pt endorses wearing Heart monitor, Covid + 2 weeks pta

## 2021-08-18 ENCOUNTER — Encounter: Payer: Self-pay | Admitting: Emergency Medicine

## 2021-08-18 ENCOUNTER — Other Ambulatory Visit: Payer: Self-pay

## 2021-08-18 ENCOUNTER — Ambulatory Visit
Admission: EM | Admit: 2021-08-18 | Discharge: 2021-08-18 | Disposition: A | Discharge status: Home / Self Care | Payer: Commercial Managed Care - PPO | Attending: Emergency Medicine | Admitting: Emergency Medicine

## 2021-08-18 DIAGNOSIS — J019 Acute sinusitis, unspecified: Secondary | ICD-10-CM | POA: Diagnosis not present

## 2021-08-18 MED ORDER — DOXYCYCLINE HYCLATE 100 MG PO CAPS: 100.0000 mg | ORAL_CAPSULE | Freq: Two times a day (BID) | ORAL | 0 refills | Status: AC

## 2021-08-18 NOTE — Discharge Instructions (Addendum)
Begin doxycycline twice daily for 1 week Continue nasal sprays and Claritin Drink plenty water and fluids Follow-up if not improving or worse

## 2021-08-18 NOTE — ED Triage Notes (Addendum)
Patient presents to [facility] for evaluation of frontal sinus pressure.  States she had a stroke in July and is trying to get in with her neurologist, but they haven't been able to see her yet.  Concerned it may be sinus infection instead,.  Patient states she has had difficulty adjusting her eyes when she wakes up for the two weeks as well, and bilateral ear pressure.

## 2021-08-19 NOTE — ED Provider Notes (Signed)
[facility]    CSN: [id number] Arrival date & time: 08/18/21  1538      History   Chief Complaint Chief Complaint  Patient presents with   Facial Pain    HPI [name] is a 50 y.o. female history of recent stroke, presenting today for evaluation of sinus pressure.  Reports over the past 2 to 3 weeks she has had sinus pressure, congestion, headaches as well as occasional drainage.  She denies any cough chest pain or shortness of breath.  Using nasal sprays, Claritin without relief of symptoms.  HPI  Past Medical History:  Diagnosis Date   Seizures (HCC)    Stroke (HCC)     There are no problems to display for this patient.   Past Surgical History:  Procedure Laterality Date   BREAST SURGERY      OB History   No obstetric history on file.      Home Medications    Prior to Admission medications   Medication Sig Start Date End Date Taking? Authorizing Provider  doxycycline (VIBRAMYCIN) 100 MG capsule Take 1 capsule (100 mg total) by mouth 2 (two) times daily for 7 days. 08/18/21 08/25/21 Yes Erico Stan C, PA-C  benzonatate (TESSALON PERLES) 100 MG capsule Take 1 capsule (100 mg total) by mouth 3 (three) times daily as needed for cough. 10/24/16   Mackuen, Courteney Lyn, MD  fluticasone (FLONASE) 50 MCG/ACT nasal spray Place 2 sprays into both nostrils daily. 08/06/21   Caccavale, Sophia, PA-C  guaiFENesin-codeine 100-10 MG/5ML syrup Take 5 mLs by mouth every 6 (six) hours as needed for cough. 10/24/16   Mackuen, Courteney Lyn, MD  ibuprofen (ADVIL,MOTRIN) 800 MG tablet Take 1 tablet (800 mg total) by mouth 3 (three) times daily. 10/24/16   Mackuen, Courteney Lyn, MD  Vitamin D, Ergocalciferol, (DRISDOL) 50000 UNITS CAPS capsule Take 50,000 Units by mouth every 7 (seven) days.    [provider]    Family History Family History  Problem Relation Age of Onset   Healthy Mother    Healthy Father     Social History Social History    Tobacco Use   Smoking status: Never   Smokeless tobacco: Never  Substance Use Topics   Alcohol use: No   Drug use: No     Allergies   Amoxicillin-pot clavulanate, Levofloxacin, and Cefuroxime axetil   Review of Systems Review of Systems  Constitutional:  Negative for activity change, appetite change, chills, fatigue and fever.  HENT:  Positive for congestion, rhinorrhea and sinus pressure. Negative for ear pain, sore throat and trouble swallowing.   Eyes:  Negative for discharge and redness.  Respiratory:  Negative for cough, chest tightness and shortness of breath.   Cardiovascular:  Negative for chest pain.  Gastrointestinal:  Negative for abdominal pain, diarrhea, nausea and vomiting.  Musculoskeletal:  Negative for myalgias.  Skin:  Negative for rash.  Neurological:  Positive for headaches. Negative for dizziness and light-headedness.    Physical Exam Triage Vital Signs ED Triage Vitals [08/18/21 1648]  Enc Vitals Group     BP 95/63     Pulse Rate 92     Resp 16     Temp 98.7 F (37.1 C)     Temp Source Oral     SpO2 96 %     Weight      Height      Head Circumference      Peak Flow      Pain Score 8  Pain Loc      Pain Edu?      Excl. in GC?    No data found.  Updated Vital Signs BP 95/63 (BP Location: Left Arm)   Pulse 92   Temp 98.7 F (37.1 C) (Oral)   Resp 16   LMP 08/02/2021   SpO2 96%   Visual Acuity Right Eye Distance:   Left Eye Distance:   Bilateral Distance:    Right Eye Near:   Left Eye Near:    Bilateral Near:     Physical Exam Vitals and nursing note reviewed.  Constitutional:      Appearance: She is well-developed.     Comments: No acute distress  HENT:     Head: Normocephalic and atraumatic.     Ears:     Comments: Bilateral ears without tenderness to palpation of external auricle, tragus and mastoid, EAC's without erythema or swelling, TM's with good bony landmarks and cone of light. Non erythematous.      Nose:  Nose normal.     Mouth/Throat:     Comments: Oral mucosa pink and moist, no tonsillar enlargement or exudate. Posterior pharynx patent and nonerythematous, no uvula deviation or swelling. Normal phonation.  Eyes:     Conjunctiva/sclera: Conjunctivae normal.  Cardiovascular:     Rate and Rhythm: Normal rate.  Pulmonary:     Effort: Pulmonary effort is normal. No respiratory distress.     Comments: Breathing comfortably at rest, CTABL, no wheezing, rales or other adventitious sounds auscultated  Abdominal:     General: There is no distension.  Musculoskeletal:        General: Normal range of motion.     Cervical back: Neck supple.  Skin:    General: Skin is warm and dry.  Neurological:     Mental Status: She is alert and oriented to person, place, and time.     UC Treatments / Results  Labs (all labs ordered are listed, but only abnormal results are displayed) Labs Reviewed - No data to display  EKG   Radiology No results found.  Procedures Procedures (including critical care time)  Medications Ordered in UC Medications - No data to display  Initial Impression / Assessment and Plan / UC Course  I have reviewed the triage vital signs and the nursing notes.  Pertinent labs & imaging results that were available during my care of the patient were reviewed by me and considered in my medical decision making (see chart for details).     Sinus pressure x2 weeks, covering for sinusitis with doxycycline, has allergy to Augmentin, continue nasal sprays and Claritin, push fluids.  Discussed strict return precautions. Patient verbalized understanding and is agreeable with plan.  Final Clinical Impressions(s) / UC Diagnoses   Final diagnoses:  Acute sinusitis with symptoms > 10 days     Discharge Instructions      Begin doxycycline twice daily for 1 week Continue nasal sprays and Claritin Drink plenty water and fluids Follow-up if not improving or worse     ED  Prescriptions     Medication Sig Dispense Auth. Provider   doxycycline (VIBRAMYCIN) 100 MG capsule Take 1 capsule (100 mg total) by mouth 2 (two) times daily for 7 days. 14 capsule Adriann Thau, Vincent C, PA-C      PDMP not reviewed this encounter.   Lew Dawes, [place] 08/19/21 [phone]

## 2021-09-11 IMAGING — US US EXTREM LOW VENOUS*R*
1 series · 14 of 24 positions shown · non-contrast
Comparison: None.

CLINICAL DATA: Right posterior knee swelling for 2 days.

EXAM:
RIGHT LOWER EXTREMITY VENOUS DOPPLER ULTRASOUND
TECHNIQUE: Gray-scale sonography with compression, as well as color and duplex
ultrasound, were performed to evaluate the deep venous system(s)
from the level of the common femoral vein through the popliteal and
proximal calf veins.

[Series 1: us extrem low venous*right* · 14 of 39 slices shown]
[im 1/39]
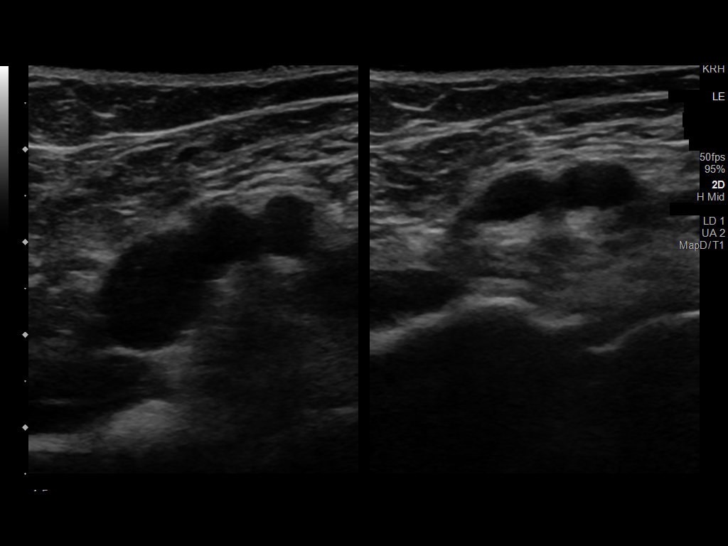
[im 4/39]
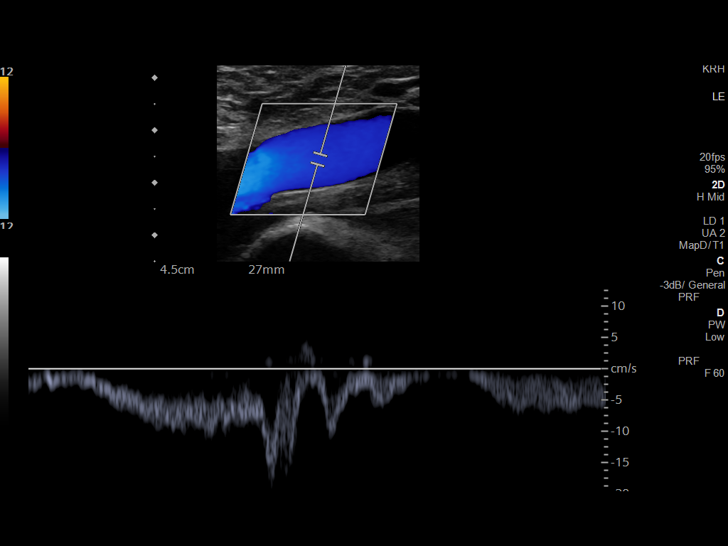
[im 7/39]
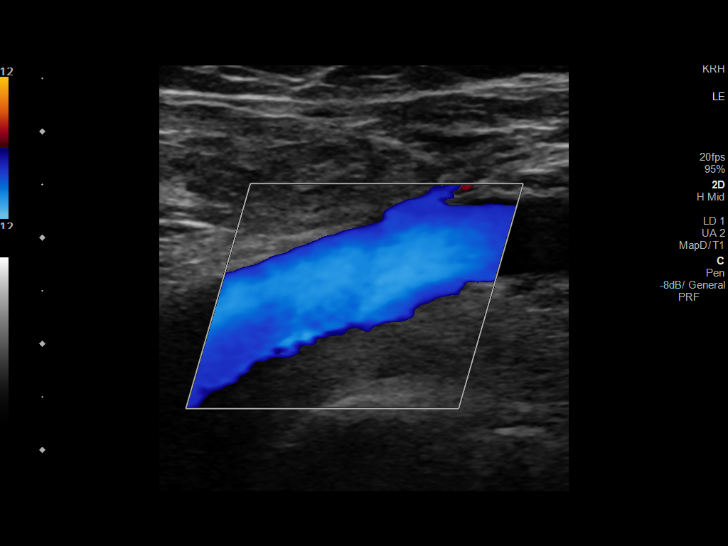
[im 10/39]
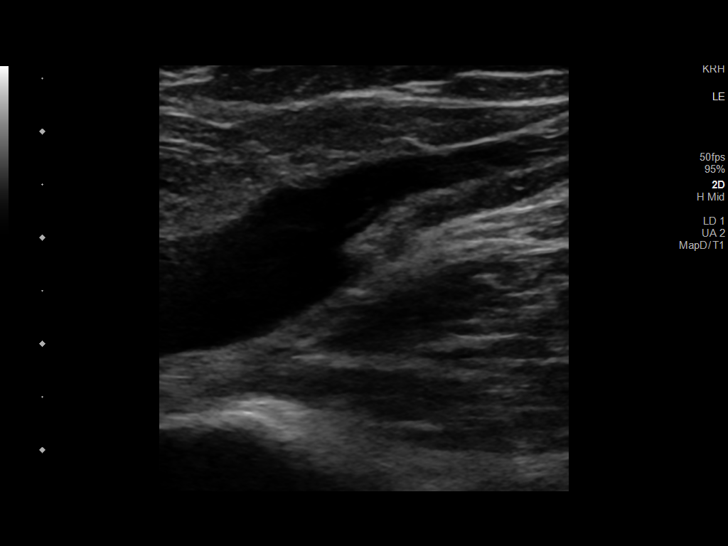
[im 12/39]
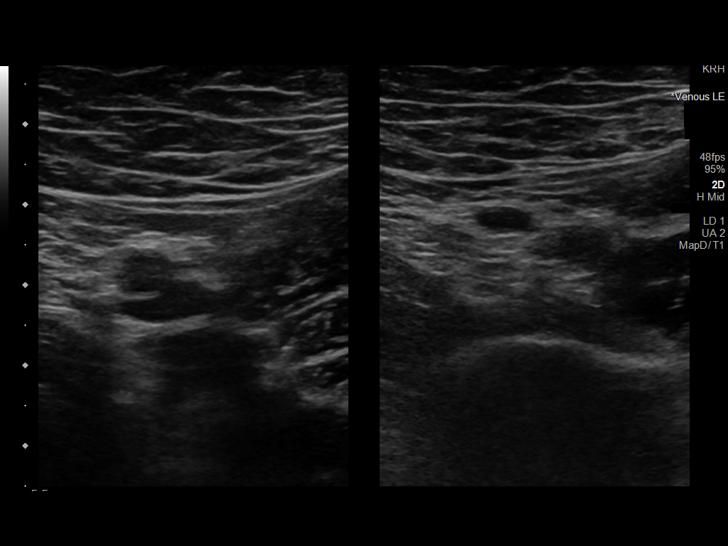
[im 15/39]
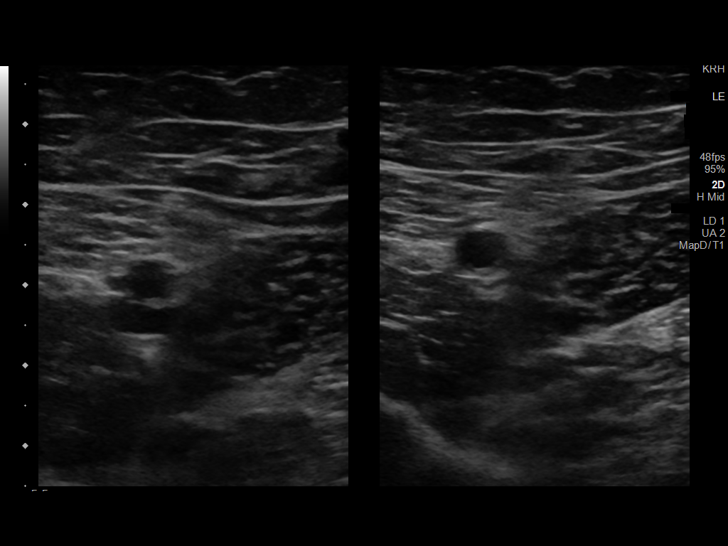
[im 19/39]
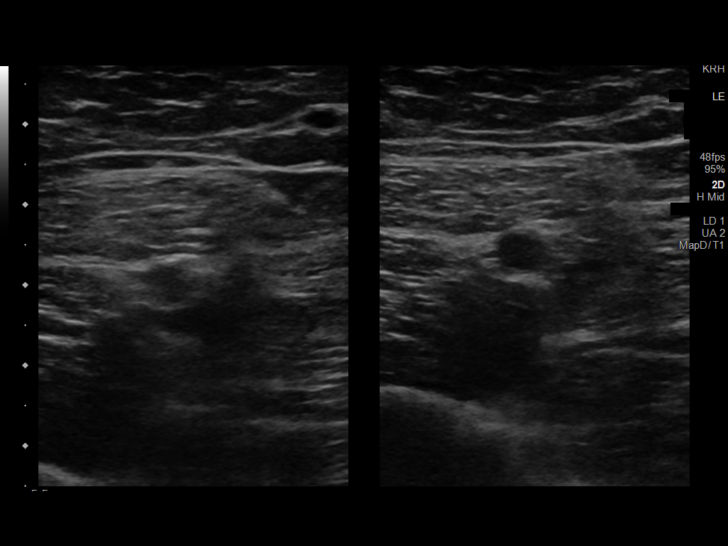
[im 20/39]
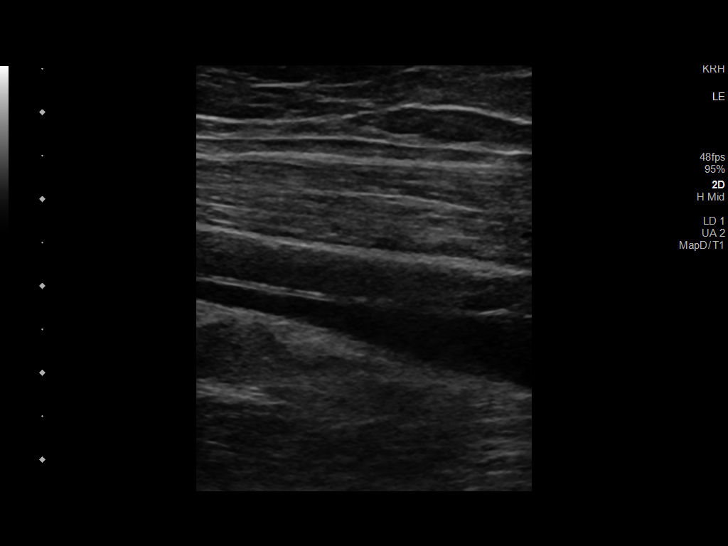
[im 24/39]
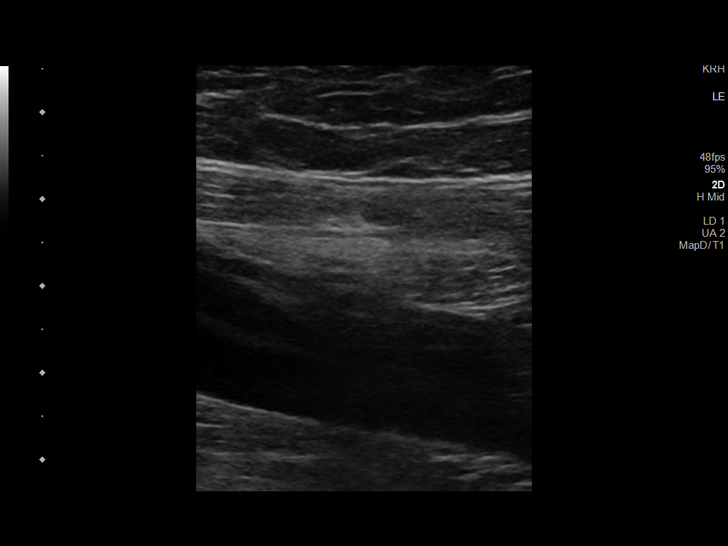
[im 27/39]
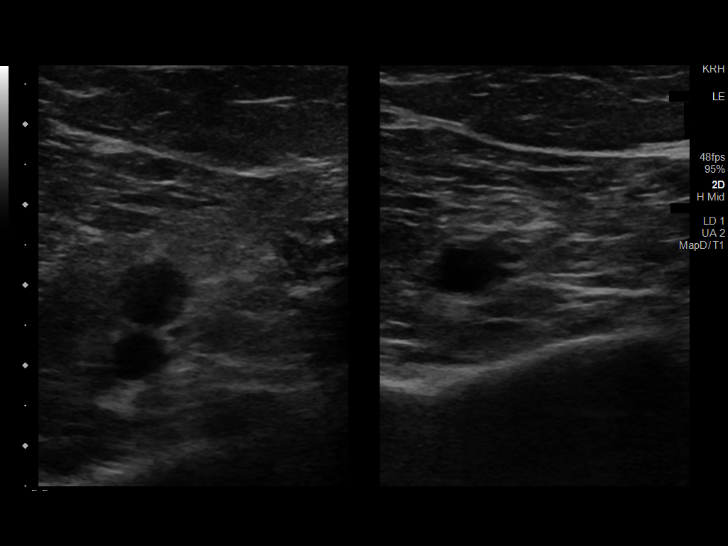
[im 30/39]
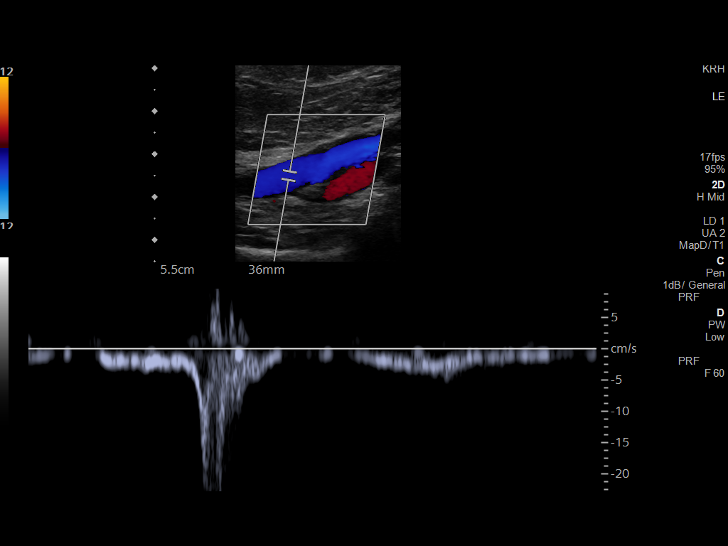
[im 32/39]
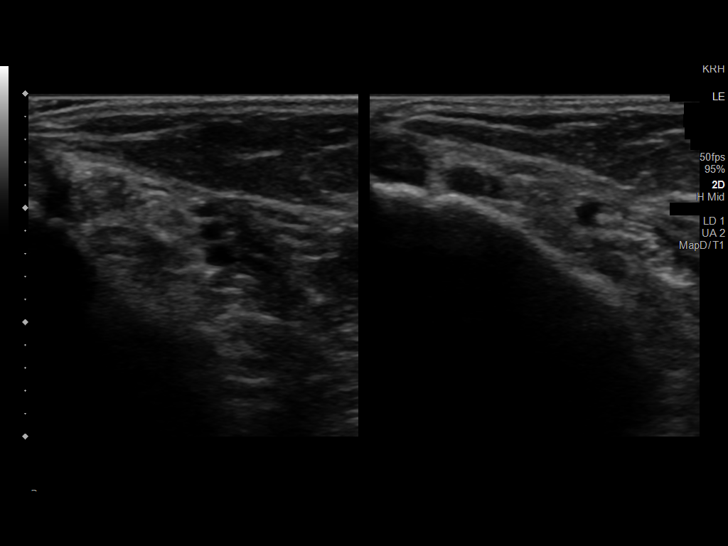
[im 35/39]
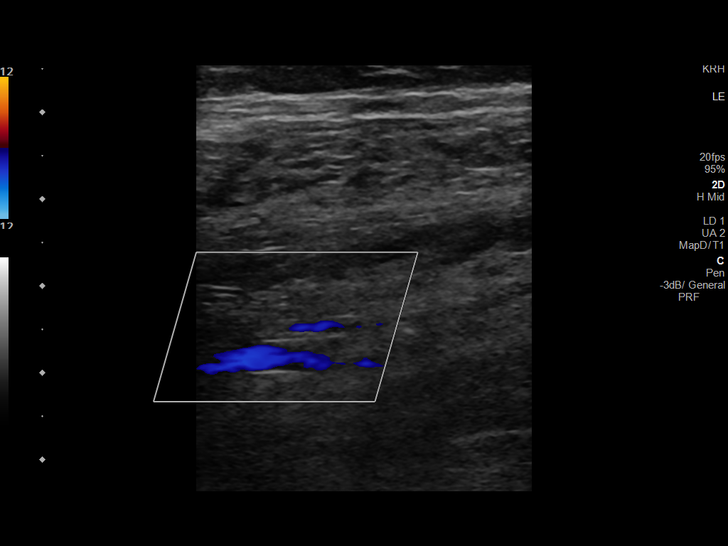
[im 39/39]
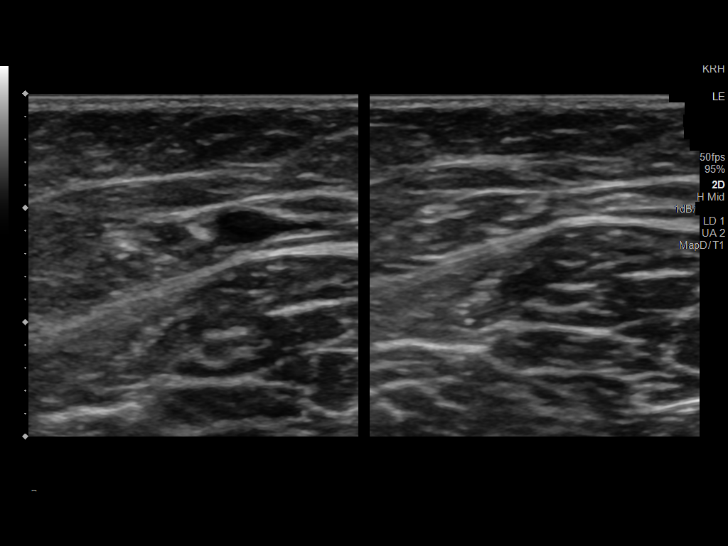

[14 of 24 positions shown; findings below may reference images not displayed]

FINDINGS: VENOUS

Normal compressibility of the common femoral, superficial femoral,
and popliteal veins, as well as the visualized calf veins.
Visualized portions of profunda femoral vein and great saphenous
vein unremarkable. No filling defects to suggest DVT on grayscale or
color Doppler imaging. Doppler waveforms show normal direction of
venous flow, normal respiratory plasticity and response to
augmentation.

Limited views of the contralateral common femoral vein are
unremarkable.

OTHER

None.

Limitations: none
IMPRESSION: Negative.

## 2021-10-05 ENCOUNTER — Encounter: Payer: Self-pay | Admitting: *Deleted

## 2021-10-05 ENCOUNTER — Other Ambulatory Visit: Payer: Self-pay | Admitting: *Deleted

## 2021-10-10 ENCOUNTER — Other Ambulatory Visit: Payer: Self-pay

## 2021-10-10 ENCOUNTER — Ambulatory Visit: Payer: Commercial Managed Care - PPO | Admitting: Diagnostic Neuroimaging

## 2021-10-10 ENCOUNTER — Encounter: Payer: Self-pay | Admitting: Diagnostic Neuroimaging

## 2021-10-10 VITALS — BP 104/69 | HR 73 | Ht 64.5 in | Wt 151.0 lb

## 2021-10-10 DIAGNOSIS — G459 Transient cerebral ischemic attack, unspecified: Secondary | ICD-10-CM | POA: Diagnosis not present

## 2021-10-10 DIAGNOSIS — G43809 Other migraine, not intractable, without status migrainosus: Secondary | ICD-10-CM | POA: Diagnosis not present
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
## 2021-10-10 NOTE — Patient Instructions (Addendum)
TIA (vs complicated migraine; in setting of COVID infx and stress on vacation in July 2022) - continue aspirin 81mg  daily; continue atorvastatin 20mg  daiy - monitor for migraine symptoms - repeat MRI in 6-12 months (asymptomatic meningioma follow up)

## 2022-03-17 ENCOUNTER — Other Ambulatory Visit: Payer: Self-pay | Admitting: Internal Medicine

## 2022-03-17 DIAGNOSIS — N2889 Other specified disorders of kidney and ureter: Secondary | ICD-10-CM

## 2022-03-31 ENCOUNTER — Ambulatory Visit
Admission: RE | Admit: 2022-03-31 | Discharge: 2022-03-31 | Disposition: A | Discharge status: Home / Self Care | Payer: Commercial Managed Care - PPO | Source: Ambulatory Visit | Attending: Internal Medicine | Admitting: Internal Medicine

## 2022-03-31 DIAGNOSIS — N2889 Other specified disorders of kidney and ureter: Secondary | ICD-10-CM

## 2022-03-31 MED ORDER — GADOBENATE DIMEGLUMINE 529 MG/ML IV SOLN
13.0000 mL | Freq: Once | INTRAVENOUS | Status: AC | PRN
  Administered 2022-03-31: 13 mL via INTRAVENOUS

## 2022-07-31 NOTE — Progress Notes (Signed)
 [facility] [address] [place], [place] [address] Ph: [phone]  Fax: [phone]   [name]  14-Aug-1971  07/31/2022  Chief Complaint: TIA & seizure-like episodes       HPI:  [name] is a 51 y.o. year old right-handed female with PMH of:  - TIA - HLD - Palpitations - Vitamin D  deficiency - Iron deficiency anemia  Patient presents to clinic today (07/31/2022) for follow up evaluation of TIA.   [name] was at her normal neurological baseline until 07/23/2021. She states that she was driving back from vacation in [place] , when she experienced sudden right arm and leg tingling followed by right face tingling and slurred speech. She pulled into a rest stop and called EMS. She was taken to [facility] [place], in [place], [place] . She states that she had several scans, (CT head x 4, and MRI brain x 1), and she was told that there was no evidence of infarct. She was also found to have COVID infection. She was treated with tPA. She was started on ASA, Plavix, and Lipitor and discharged with plan to follow up with Neurology as an outpatient.   She states that since discharge, she has had a feeling of pressure in the head without headache. She has some pain in the left eye lasting a few seconds, 2-3 times per week. She denies persistent changes in vision. She endorses mild right arm and leg weakness with paresthesia. She has occasional tingling that may occur in the left and right ear, left foot, and BL hands.   Prior note, 09/27/2021: Patient was last seen in Neurology clinic by me on 08/02/2021. At her last visit, patient was started on ASA & Lipitor. Plavix was continued for 21 days, (until 08/13/2021). She stopped taking Lipitor.   MRI brain (07/2021) showed R perirolandic susceptibility artifact, suspected to be due to small partially calcified meningioma. CTA head & neck (06/2021) did not show any  high grade stenosis or LVO. Zio patch (07/2021) did not show any Afib. TTE (06/2021) did not show any shunt or WMA. TEE (09/2021) did not show any shunt or WMA.  She developed L face numbness & tingling and intermittent BL toe pain without numbness in August, 2022, which has improved since onset. She also has episodes when she feels like she may lose consciousness, and episodes of L arm and L leg jerking.   Prior note, 01/05/2022: Patient was last seen in Neurology clinic by me on 09/27/2021. At her last visit, patient was continued on ASA & Lipitor. She was started on Magnesium to help with headaches on 11/10/2021. She stopped taking ASA and Lipitor.   Routine EEG, (10/2021), showed intermittent slowing that was generalized, maximum in the left frontotemporal region.  Follow up sleep deprived EEG, (10/2021), was normal.  EMG, (10/2021), was normal.  Follow up MRI brain wwo, (10/2021), showed a stable R frontal calcified meningioma without enhancement.   Labs including B12, MMA, B1, SPEP, vit D, TSH, and hypercoagulable workup were negative.   Since her last visit, she has continued to have episodes of numbness in the left face, as well as feeling of heat with some tingling the left calf and right calf and BL feet, which may radiate up to the right thigh. She had one episode of tingling in the left index finger.   She has continued to have daily episodes of L arm and L leg jerking, which may  progress to full body jerking without loss of consciousness.   Headaches have improved.   She states that she was diagnosed with lupus and would like a referral to Rheumatology.  Prior note, 04/13/2022: Patient was last seen in Neurology clinic by me on 01/05/2022. At her last visit, patient was started on Topamax for headache, and she was continued on Magnesium, ASA, and Lipitor.  She was seen by Rheumatology on 01/17/2022, and her risk of SLE was felt to be low, given negative ANA. Follow up ANA was  positive.  Since her last visit, she has continued to have daily seizure-like episodes with L arm and L leg jerking, which may progress to full body jerking without loss of consciousness, and tend to be provoked by being startled.  Episodes of numbness in the left face, as well as feeling of heat with some tingling the left calf and right calf and BL feet, which may radiate up to the right thigh have improved.   She had one episode of tingling in the left index finger, which have not recurred since her last visit. She complains of 3 episodes of R ring finger muscle spasm within 1 hour.   Headaches have not improved. She complains of R temporal head pain without headache, with tenderness to palpation. She denies changes in vision, or pain with chewing.   Update, 07/31/2022: Patient was last seen in Neurology clinic by me on 04/13/2022. At her last visit, patient was continued on ASA, Lipitor, & Magnesium.  She stopped taking all prescribed medications because she was feeling better.  ESR & CRP, (10/29/2017 & 04/13/2022), were normal.  Since her last visit, seizure-like episodes have improved.  Headaches have improved. She complains of R or L temporal head pain without headache, with tenderness to palpation. She denies changes in vision, or pain with chewing.   Seizure-like episode history: Type1: Onset: 2023 Description: Slight mouth twitch/jerking, or L arm and L leg jerking, which may progress to full body jerking without LOC, and tend to be provoked by being startled Post-ictal: None Associated sx: No bowel/bladder incontinence, no tongue biting Frequency: 10 per week Duration: 1-2 seconds Triggers: Being startled Last event: 07/31/2022  Previous headache preventatives: Topiramate (Topamax)  Previous headache abortives: None   Current headache preventative: Magnesium Oxide 400 mg qHS  Current headache abortive: None  A complete ROS was performed with positive/negative  pertinent findings listed above and in the HPI. All other systems are negative.   Allergies  Allergen Reactions  . Amoxicillin-Pot Clavulanate Other (See Comments)    Tingling Other reaction(s): Other (See Comments) Numbness Tingling  . Levaquin [Levofloxacin] Diarrhea  . Cefuroxime Axetil Other (See Comments) and Rash (ALLERGY/intolerance)    Tingling Tingling   No current outpatient medications on file.   Past Medical History:  Diagnosis Date  . Allergy   . Cyst, kidney, acquired    Past Surgical History:  Procedure Laterality Date  . BREAST BIOPSY Left    in her 30's  . BREAST EXCISIONAL BIOPSY Right    age 72, benign  . BREAST EXCISIONAL BIOPSY Right    age 60's, benign  . CYST REMOVAL     Family History  Problem Relation Age of Onset  . Allergies Mother   . Allergies Father   . Allergies Sister   . Glaucoma Sister   . Glaucoma Paternal Aunt   . Breast cancer Paternal Aunt 28  . Breast cancer Paternal Grandmother 40  . Macular degeneration Neg Hx   .  Psoriasis Neg Hx   . Ankylosing spondylitis Neg Hx   . Lupus Neg Hx   . Rheum arthritis Neg Hx   . Gout Neg Hx    Social History    Tobacco History    Smoking Status Never Smoker   Smokeless Tobacco Use Never Used       Alcohol History    Alcohol Use Status No       Drug Use    Drug Use Status No         BP 112/74   Pulse 74   Resp 16   Ht 1.626 m (5' 4.02)   Wt 70.8 kg (156 lb)   BMI 26.76 kg/m  General Examination: General Appearance:  In no apparent distress.  Eyes: sclera anicteric HENT:  Normocephalic atraumatic, mucous membranes moist.   Lungs: Breathing comfortably on room air Skin:  Warm, dry, no notable rashes or lesions on visible surfaces. Extremities:  No clubbing, cyanosis, edema. Psych: No significant anxiety or emotional lability.  Pt is cooperative and appropriate.   Neurological Exam: Cognition:   Alert, oriented x3; speech is fluent and comprehension is intact.  No  significant issues with memory, cognition, or attention apparent. Cranial Nerves:  Pupils equal, round and reactive to light. Extraocular movements are full, no nystagmus. Visual fields are full.  Facial strength symmetric.  Facial sensation normal / symmetric.  Tongue and palate midline.  Voice non-dysarthric.   Motor:  Bulk: normal; Tone: normal.  No Pronator drift.  No abnormal or involuntary movements.    Strength:   Right Left     Right Left  Deltoid 5 5   Hip Flexion  5 5   Biceps 5 5    Knee Extension  5 5   Triceps 5 5    Knee Flexion  5 5   Handgrip 5  5             Sensation:  Intact and symmetric to light touch and temperature throughout.  No significant sway with Romberg's testing. Coordination:  Finger to nose testing normal bilaterally. Gait:  Normal base, stride length, and arm swing.  Relevant Data  Reviewed today? Study/Test Report Summary  Yes Most recent relevant CNS imaging MRI brain wwo, 10/27/2021: 1. No evidence for residual or recurrent meningioma. 2. Stable anterior right frontal arachnoid cyst. 3. Otherwise normal MRI appearance of the brain.  MRI brain wo, 07/25/2021: No acute infarct of intracranial hemorrhage.  R perirolandic susceptibility artifact, likely related to small partially calcified meningioma, as seen on prior CT head.   CTA head & Neck, 07/23/2021: Normal CTA of the head and neck. No LVO.    Yes   Relevant cardiac testing TEE, 09/26/2021: Normal LV size, wall thickness, wall motion and systolic function with ejection fraction 60-65% The right ventricle is normal in size and function. The atria are normal in size. There is no Doppler evidence for a patent foramen ovale. Injection of agitated saline showed no right-to-left shunt. No thrombus is detected in the left atrial appendage. There is mild mitral regurgitation after being volume loaded for TEE. RVSP not able to be calculated.  TTE, 07/23/2021: No right-to-left shunt. No WMA. LVEF  60%  Zio patch, 08/02/2021: Very frequent triggered events for sinus rhythm and isolated PACs and PVCs event listed as SVT is more appropriately consecutive PACs.   Yes Prior EEG Sleep deprived EEG, 11/15/2021: This EEG recorded during wakefulness and drowsiness is normal. No definite epileptiform discharges or EEG  seizures were seen during this recording.  Routine EEG, 10/11/2021: This EEG recorded during wakefulness and drowsiness shows evidence of a moderate encephalopathy with focal cortical dysfunction in the left frontotemporal region. No epileptiform discharges or EEG seizures were seen during this recording.   These features should be correlated with previous electroencephalography and imaging studies.   Yes Prior nerve testing EMG/NCV, 10/28/2021: This is a normal study.  There is no evidence of a generalized large fiber peripheral neuropathy or right lumbosacral radiculopathy on this exam.  There is a right accessory fibular nerve which explains the amplitude difference between the 2 sides at the ankle.   Yes Labs Component     Latest Ref Rng & Units 09/27/2021  WBC     4.4 - 11.0 x 10*3/uL 7.5  HEMOGLOBIN     12.3 - 15.3 G/DL 88.3 (L)  MCV     19.9 - 96.0 FL 84.9  PLATELET COUNT     150 - 450 X 10*3/uL 347   Component     Latest Ref Rng & Units 07/08/2021 09/27/2021  SODIUM     135 - 146 MMOL/L 140 139  POTASSIUM     3.5 - 5.3 MMOL/L 3.9 4.2  GLUCOSE     70 - 99 MG/DL 90 84  CREATININE     9.39 - 1.20 MG/DL 9.27 9.39  CALCIUM      8.5 - 10.5 MG/DL 9.1 9.3  AST     13 - 39 IU/L or U/L 15 13  ALT     7 - 52 IU/L or U/L 11 10   Component     Latest Ref Rng & Units 07/08/2021 09/27/2021  TRANSFERRIN     212 - 360 MG/DL 739 744  IRON     28 - 170 mcg/dL 73 50  FERRITIN     11 - 310 NG/ML  24  TIBC     250 - 435 mcg/dL  642  UIBC     844 - 644 mcg/dL  706  % SATURATION     15 - 50 % 20 14 (L)  TIBC     261 - 478 mcg/dL 635    Component     Latest Ref Rng &  Units 07/08/2021 09/27/2021  TSH     0.45 - 5.00 UIU/ML 0.83 1.14  Vit D, 25-Hydroxy     30 - 100 ng/mL  44   Component     Latest Ref Rng & Units 03/11/2018 12/10/2020 09/27/2021  COMMENT SPE        NO M SPIKE SEEN.  METHYLMALONIC ACID     <400 nmol/L   79  VITAMIN B12     180 - 914 pg/mL 301 492 1,020 (H)  Vitamin B1, Whole Blood     66.5 - 200.0 nmol/L   116.6   Component     Latest Ref Rng & Units 09/27/2021 10/31/2021 01/17/2022  Beta-2 Glyco 1 IgG     0 - 20 GPI IgG units  <9   Beta-2 Glyco 1 IgM     0 - 32 GPI IgM units  <9   SPECKLED PATTERN        1:80  NOTE:        Comment  ANTINUCLEAR ANTIBODIES, IFA      Negative  Positive (A)  CARDIOLIPIN IGA AB     0 - 11 APL U/mL  <9   CARDIOLIPIN IGM AB     0 - 12 MPL U/mL  11   CARDIOLIPIN IGG AB     0 - 14 GPL U/mL  <9   LUPUS INHIBITOR WITH     NOT DETECTED  NOT DETECTED   COMPLEMENT C3     87 - 200 MG/DL   873  COMPLEMENT C4     19 - 52 MG/DL   34  CENTROMERE B ANTIBODY     0.0 - 0.9 AI   <0.2  dsDNA Ab by Crithidia IFA, IgG, S     Negative   Negative  SMITH AB     0.0 - 0.9 AI   <0.2  SJOGRENS AB (SSB), MAILOUT     0.0 - 0.9 AI   <0.2  SJOGRENS AB (SSA), MAILOUT     0.0 - 0.9 AI   0.2  SCL 70 Ab     0.0 - 0.9 AI   <0.2  RNP ANTIBODY     0.0 - 0.9 AI   1.3 (H)   Component     Latest Ref Rng & Units 09/27/2021 04/13/2022  LDL     <100 mg/dL 871 (H) 853 (H)   Component     Latest Ref Rng & Units 10/29/2017 04/13/2022  Sed Rate     0 - 30 MM/HR 12 6  CRP, NON-CARDIAC     <5.0 MG/L <5.0 <5.0   Vitamin B6, 12/23/2021: 65.6 (H, 3.4-65.2) Anti-DS DNA, 12/23/2021:  34, (H, 0-9)       Assessment: [name] is a 51 y.o. year old right-handed female with PMH of TIA, on ASA, HLD, on Lipitor, palpitations, vitamin D  deficiency, and iron deficiency anemia who presents to clinic today (07/31/2022) for follow up evaluation of TIA on 07/23/2021. Neurological examination essentially unremarkable.  MRI brain,  (07/2021), showed R perirolandic susceptibility artifact, suspected to be due to small partially calcified meningioma. CTA head & neck, (06/2021), did not show any high grade stenosis or LVO. Zio patch, (07/2021), did not show any Afib. TTE, (06/2021), did not show any shunt or WMA.  TEE, (09/2021), did not show any shunt or WMA. Routine EEG, (10/11/2021), showed intermittent slowing that was generalized, maximum in the left frontotemporal region. Sleep deprived EEG, (11/15/2021), was normal. EMG, (10/28/2021), was normal. MRI brain wwo, (10/27/2021), showed a stable R frontal calcified meningioma without enhancement.  B12, MMA, B1, SPEP, vit D, TSH, and hypercoagulable workup were negative.  Vitamin B6, (12/23/2021) was slightly elevated, (65.6, 3.4-65.2) Anti-DS DNA, (12/23/2021), was positive.  Per Rheumatology, (01/17/2022), risk of SLE felt to be low, given negative ANA. ANA, (12/2021), was positive. ESR & CRP, (10/29/2017 & 04/13/2022), were normal. Suspect etiology of recurrent migratory paresthesia may represent a migraine variant. Recurrent TIA considered less likely, as we would expect to see some evidence of ischemia with recurrent events over time on MRI.  Seizure considered much less likely given time course and EEG results, although cannot entirely rule out. Some paresthesia may be explained by B6 toxicity.  Plan of care is as follows:  Plan: Stroke/TIA prevention: Continue Aspirin  81 mg daily Continue Atorvastatin  (Lipitor) 20 mg daily For headache/migraine: Preventative therapy: Continue Magnesium Oxide 400 mg at bedtime. As needed: Avoid triptans given history of recurrent TIA Consider trial of Ubrogepant (Ubrelvy) 100 mg at the start of a migraine or aura. You can repeat this dose after 2 hours if needed. Do not exceed a total dose of 200 mg per day, or more than 2 days per week. Patient advised to avoid taking over the  counter pain medications more than 2 times per week, to  avoid medication overuse headache. Consider R temporal artery biopsy. Patient encouraged to maintain proper hydration of at least 60 to 80 ounces of water per day Patient instructed to keep a headache diary. Continue to follow with Rheumatology for SLE.  Consider repeat EMG Ambulatory EEG to better characterize seizure-like episodes. Repeat MRI brain to evaluate R perirolandic meningioma. Continue to follow with Cardiology for palpitations Follow up in about 3 months. Instructed patient to call with any questions or concerns.  All of the patient's questions were answered, and they verbalized understanding.   I have personally spent 30 minutes involved in face-to-face and non-face-to-face activities for this patient on the day of the visit.  Professional time spent includes the following activities, in addition to those noted in the documentation: Chart review, face-to-face counseling, and coordinating care    Electronically signed by: Alfonso Nat Boone, MD   This document was created using the aid of voice recognition Dragon dictation software.   Electronically signed by: Alfonso Nat Boone, MD 07/31/22 1024

## 2022-08-04 NOTE — Progress Notes (Signed)
 Assessment 1. Positive ANA (antinuclear antibody)      2. History of stroke        Plan/Orders Patient with positive ANA, positive RNP, history of stroke, negative APS labs. Possible ?UCTD. We could consider plaquenil for rash if she wants, otherwise we will observe symptoms. Return in about 1 year (around 08/05/2023).  Chief Complaint [name] is here for follow up positive ANA.  HPI Follow up positive ANA. She went to alternative medicine recently and had positive RNP and dsDNA and diagnosed with lupus. History of transient ischemic attack/CVA.  Patient experienced right-sided weakness while she was returning from [place]  in July 2022.  She was seen and evaluated at [facility] in [place],  .  She was diagnosed with a COVID-19 infection and MRI scan of the brain and a CT scan of the head showed no evidence of an acute infarction or bleeding.  She was treated with thrombolytic therapy and was started on aspirin , Plavix and Lipitor.  She has made 100% recovery.  She has established care with neurology and cardiology in [facility], [place] .  As per her medical records there is no evidence of sustained atrial arrhythmias including atrial fibrillation.  A transthoracic echocardiogram and a transesophageal echocardiogram showed no significant abnormalities other than trace mitral regurgitation which was not felt to be pathological.  She has undergone an EEG which shows no epileptiform discharges or EEG seizures.  The MRI scan of the brain repeated on 10/27/2021 shows no space-occupying lesions or infarction. T here is a 5 mm calcified meningioma and a frontal arachnoid cyst which are stable. The patient has no history of any other arterial events.  She denies any history of venous thromboembolism.  She denies any history of miscarriages.  She has seen hematology and anti phospholipid labs were negative. +Malar rash. No  photosensitivity rashes  No Raynaud's symptoms  No mucosal ulcers No mouth or eye dryness No hx of pleurisy or pericarditis  No history of DVT, MI, or strokes.  No history of hematuria or nephritis  No hot swollen joints.   Vital Signs BP 114/70 (Site: Left arm, Position: Sitting, BP Cuff Size: Medium)   Pulse 80   Wt 71.2 kg (157 lb)   SpO2 98%   BMI 26.94 kg/m  Physical Exam Pt is alert and oriented x 3. Answers questions appropriately.  Extremities: no cyanosis, no edema, no calves tenderness. Skin: warm, dry. Malar rash.  No hot swollen joints.  PMH Past Medical History:  Diagnosis Date  . Allergy   . Cyst, kidney, acquired     PSH Past Surgical History:  Procedure Laterality Date  . BREAST BIOPSY Left    in her 30's  . BREAST EXCISIONAL BIOPSY Right    age 51, benign  . BREAST EXCISIONAL BIOPSY Right    age 10's, benign  . CYST REMOVAL      Allergies Amoxicillin-pot clavulanate, Levaquin [levofloxacin], and Cefuroxime axetil  Medications Current Outpatient Medications on File Prior to Visit  Medication Sig Dispense Refill  . [DISCONTINUED] aspirin  81 MG chewable tablet *ANTIPLATELET* Chew 1 tablet (81 mg total) by mouth daily. 90 tablet 3  . [DISCONTINUED] atorvastatin  (LIPITOR) 20 MG tablet Take 1 tablet (20 mg total) by mouth daily. 90 tablet 3   No current facility-administered medications on file prior to visit.     Family Hx Family History  Problem Relation Age of Onset  . Allergies Mother   . Allergies Father   . Allergies  Sister   . Glaucoma Sister   . Glaucoma Paternal Aunt   . Breast cancer Paternal Aunt 41  . Breast cancer Paternal Grandmother 32  . Macular degeneration Neg Hx   . Psoriasis Neg Hx   . Ankylosing spondylitis Neg Hx   . Lupus Neg Hx   . Rheum arthritis Neg Hx   . Gout Neg Hx     Social Hx  Social History   Tobacco Use  . Smoking status: Never  . Smokeless tobacco: Never  Substance Use Topics  . Alcohol use:  No  . Drug use: No        Electronically signed by: Redell Lorene Ness, PA-C 08/04/22 1415

## 2023-03-21 ENCOUNTER — Ambulatory Visit (INDEPENDENT_AMBULATORY_CARE_PROVIDER_SITE_OTHER): Payer: Commercial Managed Care - PPO | Admitting: Family Medicine

## 2023-03-21 ENCOUNTER — Encounter: Payer: Self-pay | Admitting: Family Medicine

## 2023-03-21 VITALS — BP 120/70 | HR 75 | Temp 98.3°F | Ht 64.0 in | Wt 168.0 lb

## 2023-03-21 DIAGNOSIS — Z1159 Encounter for screening for other viral diseases: Secondary | ICD-10-CM

## 2023-03-21 DIAGNOSIS — Z8673 Personal history of transient ischemic attack (TIA), and cerebral infarction without residual deficits: Secondary | ICD-10-CM | POA: Diagnosis not present

## 2023-03-21 DIAGNOSIS — R1031 Right lower quadrant pain: Secondary | ICD-10-CM

## 2023-03-21 DIAGNOSIS — Z1211 Encounter for screening for malignant neoplasm of colon: Secondary | ICD-10-CM

## 2023-03-21 DIAGNOSIS — Z0000 Encounter for general adult medical examination without abnormal findings: Secondary | ICD-10-CM

## 2023-03-21 DIAGNOSIS — D500 Iron deficiency anemia secondary to blood loss (chronic): Secondary | ICD-10-CM

## 2023-03-21 DIAGNOSIS — Z114 Encounter for screening for human immunodeficiency virus [HIV]: Secondary | ICD-10-CM

## 2023-03-21 DIAGNOSIS — G8929 Other chronic pain: Secondary | ICD-10-CM

## 2023-03-21 LAB — POCT URINALYSIS DIPSTICK
Bilirubin, UA: NEGATIVE
Blood, UA: NEGATIVE
Glucose, UA: NEGATIVE
Ketones, UA: NEGATIVE
Leukocytes, UA: NEGATIVE
Nitrite, UA: NEGATIVE
Protein, UA: NEGATIVE
Spec Grav, UA: 1.015 (ref 1.010–1.025)
Urobilinogen, UA: 0.2 E.U./dL
pH, UA: 6 (ref 5.0–8.0)

## 2023-03-21 LAB — LIPID PANEL
Cholesterol: 251 mg/dL — ABNORMAL HIGH (ref 0–200)
HDL: 58.1 mg/dL (ref >39.00)
NonHDL: 192.63
Total CHOL/HDL Ratio: 4
Triglycerides: 227 mg/dL — ABNORMAL HIGH (ref 0.0–149.0)
VLDL: 45.4 mg/dL — ABNORMAL HIGH (ref 0.0–40.0)

## 2023-03-21 LAB — CBC WITH DIFFERENTIAL/PLATELET
Basophils Absolute: 0 10*3/uL (ref 0.0–0.1)
Basophils Relative: 0.5 % (ref 0.0–3.0)
Eosinophils Absolute: 0.1 10*3/uL (ref 0.0–0.7)
Eosinophils Relative: 1.9 % (ref 0.0–5.0)
HCT: 37.1 % (ref 36.0–46.0)
Hemoglobin: 12.3 g/dL (ref 12.0–15.0)
Lymphocytes Relative: 30.9 % (ref 12.0–46.0)
Lymphs Abs: 2.1 10*3/uL (ref 0.7–4.0)
MCHC: 33.1 g/dL (ref 30.0–36.0)
MCV: 83.5 fl (ref 78.0–100.0)
Monocytes Absolute: 0.4 10*3/uL (ref 0.1–1.0)
Monocytes Relative: 6.4 % (ref 3.0–12.0)
Neutro Abs: 4.2 10*3/uL (ref 1.4–7.7)
Neutrophils Relative %: 60.3 % (ref 43.0–77.0)
Platelets: 353 10*3/uL (ref 150.0–400.0)
RBC: 4.44 Mil/uL (ref 3.87–5.11)
RDW: 13.1 % (ref 11.5–15.5)
WBC: 6.9 10*3/uL (ref 4.0–10.5)

## 2023-03-21 LAB — COMPREHENSIVE METABOLIC PANEL
ALT: 26 U/L (ref 0–35)
AST: 24 U/L (ref 0–37)
Albumin: 4.5 g/dL (ref 3.5–5.2)
Alkaline Phosphatase: 82 U/L (ref 39–117)
BUN: 11 mg/dL (ref 6–23)
CO2: 31 mEq/L (ref 19–32)
Calcium: 9.4 mg/dL (ref 8.4–10.5)
Chloride: 101 mEq/L (ref 96–112)
Creatinine, Ser: 0.65 mg/dL (ref 0.40–1.20)
GFR: 102.71 mL/min (ref >60.00)
Glucose, Bld: 91 mg/dL (ref 70–99)
Potassium: 4 mEq/L (ref 3.5–5.1)
Sodium: 138 mEq/L (ref 135–145)
Total Bilirubin: 0.5 mg/dL (ref 0.2–1.2)
Total Protein: 7.3 g/dL (ref 6.0–8.3)

## 2023-03-21 LAB — IBC + FERRITIN
Ferritin: 18.7 ng/mL (ref 10.0–291.0)
Iron: 94 ug/dL (ref 42–145)
Saturation Ratios: 23.4 % (ref 20.0–50.0)
TIBC: 401.8 ug/dL (ref 250.0–450.0)
Transferrin: 287 mg/dL (ref 212.0–360.0)

## 2023-03-21 LAB — HEMOGLOBIN A1C: Hgb A1c MFr Bld: 5.8 % (ref 4.6–6.5)

## 2023-03-21 LAB — TSH: TSH: 1.12 u[IU]/mL (ref 0.35–5.50)

## 2023-03-21 LAB — VITAMIN B12: Vitamin B-12: 744 pg/mL (ref 211–911)

## 2023-03-21 LAB — LDL CHOLESTEROL, DIRECT: Direct LDL: 153 mg/dL

## 2023-03-21 NOTE — Progress Notes (Signed)
New Patient Office Visit  Subjective:  Patient ID: [name], female    DOB: Jun 14, 1971  Age: 52 y.o. MRN: [id number]  CC:  Chief Complaint  Patient presents with   Establish Care    Initial visit to establish care with new pcp Feels like something is moving in abdomen started 6 to 8 months ago Fasting       HPI [name] presents for new pt.  Abd issues CVA in 2022 after covid-speech off-better now.  Felt so sick after that and just starting to feel better past 2 months. Was on mult meds after CVA-off  all meds. CVA thought to be d/t covid  No clotting disorder-per heme.   Some twitching in eye still at times.  Had very hard time going to work, etc-but did.  But better for 2 mo.  6-8 mo, intermitt sens of "something moving in RLQ".  Some thing "floating". Pressure/"vein vibrating".  Can go up to RUQ.  Some "discomfort".  Can do days or months w/o notice.  Lasts few sec.no gas. Would feel more right before menses. Will see gyn on 4/22.  No n/v/d/c.  But perf bowel >82yrs ago and stool changed since. Can get diarrhea/soft stools after eating.  Had gotten bowel cleanse and perf.  No surgery. Was in hosp 1 wk.  Has had ct scan 1 yr ago.  Wanting labs-haven't had chol for >22yr.  H/o anemia-no etiol   functional med doc in past.    Past Medical History:  Diagnosis Date   Irregular heart beat    PVCs, PACs   Stroke ([place]) 07/24/2021    Past Surgical History:  Procedure Laterality Date   BREAST SURGERY     tumor removed    Family History  Problem Relation Age of Onset   Hypertension Mother    Hyperlipidemia Mother    Healthy Mother    Stroke Father 61 - 68   Hypertension Father    Hyperlipidemia Father    Healthy Father     Social History   Socioeconomic History   Marital status: Married    Spouse name: [name]   Number of children: 1   Years of education: Not on file   Highest education level: Bachelor's degree (e.g., BA, AB, BS)  Occupational History    Occupation: HR  Tobacco Use   Smoking status: Never   Smokeless tobacco: Never  Vaping Use   Vaping Use: Never used  Substance and Sexual Activity   Alcohol use: No   Drug use: Never   Sexual activity: Not on file  Other Topics Concern   Not on file  Social History Narrative   Lives with husband   Social Determinants of Health   Financial Resource Strain: Not on file  Food Insecurity: Not on file  Transportation Needs: Not on file  Physical Activity: Not on file  Stress: Not on file  Social Connections: Not on file  Intimate Partner Violence: Not on file    ROS  ROS: Gen: no fever, chills.   +fatigue.  Feels "sick" all the time-but better. Skin: no rash, itching ENT: no ear pain, ear drainage, nasal congestion, rhinorrhea, sinus pressure, sore throat Eyes: no blurry vision, double vision Resp: no cough, wheeze,SOB CV: no CP, palpitations, LE edema,  GI: HPI GU:old frequency.  LMP 2/5.  Preg neg. Gets mamm at times.  Last nov 2023 MSK:+ANA-saw rheum-butterfly rash/malasma-w/u neg Neuro: no headache, weakness.  + intermitt dizziness Psych: no depression, anxiety, insomnia,  SI   Objective:   Today's Vitals: BP 120/70   Pulse 75   Temp 98.3 F (36.8 C) (Temporal)   Ht 5\' 4"  (1.626 m)   Wt 168 lb (76.2 kg)   LMP 02/18/2023 (Approximate)   SpO2 97%   BMI 28.84 kg/m   Physical Exam  Gen: WDWN NAD HEENT: NCAT, conjunctiva not injected, sclera nonicteric TM WNL B, OP moist, no exudates  NECK:  supple, no thyromegaly, no nodes, no carotid bruits CARDIAC: RRR, S1S2+, no murmur. DP 2+B LUNGS: CTAB. No wheezes ABDOMEN:  BS+, soft, mildly tender all over esp RLQ., No HSM, no masses EXT:  no edema MSK: no gross abnormalities.  NEURO: A&O x3.  CN II-XII intact.  PSYCH: normal mood. Good eye contact   Assessment & Plan:   Problem List Items Addressed This Visit   None Visit Diagnoses     Wellness examination    -  Primary   Relevant Orders   Comprehensive  metabolic panel (Completed)   Hemoglobin A1c (Completed)   Hepatitis C antibody (Completed)   HIV Antibody (routine testing w rflx) (Completed)   Lipid panel (Completed)   IBC + Ferritin (Completed)   TSH (Completed)   Vitamin B12 (Completed)   CBC with Differential/Platelet (Completed)   POCT urinalysis dipstick (Completed)   Screen for colon cancer       Relevant Orders   Cologuard   Screening for HIV without presence of risk factors       Relevant Orders   HIV Antibody (routine testing w rflx) (Completed)   Encounter for hepatitis C screening test for low risk patient       Relevant Orders   Hepatitis C antibody (Completed)   Iron deficiency anemia due to chronic blood loss       Relevant Orders   IBC + Ferritin (Completed)   Vitamin B12 (Completed)   History of cardioembolic cerebrovascular accident (CVA)       Chronic RLQ pain          Wellness-Wellness-anticipatory guidance.  Work on [employer].  Check CBC,CMP,lipids,TSH, A1C.  F/u 1 yr  will see gyn next mo.  Colon ca screen-pt wants to do cologard. H/o anemia-check iron,B12 H/o CVA after covid-resolved.  W/u neg.  Pt stopped all meds. RLQ abd pain-?scar tissue, IBS,gyn, other.  She had normal CT 1 yr ago.  Will see gyn next mo-she will see what they say.  F/u 1 mo  Outpatient Encounter Medications as of 03/21/2023  Medication Sig   MAGNESIUM PO Take by mouth daily.   Multiple Vitamin (MULTIVITAMIN) tablet Take 1 tablet by mouth daily.   Vitamin D-Vitamin K (K2 PLUS D3 PO) Take by mouth daily.   [DISCONTINUED] atorvastatin (LIPITOR) 20 MG tablet Take 1 tablet by mouth daily. (Patient not taking: Reported on 10/10/2021)   [DISCONTINUED] [facility] ASPIRIN ADULT LOW DOSE 81 MG chewable tablet Chew 81 mg by mouth daily.   No facility-administered encounter medications on file as of 03/21/2023.    Follow-up: Return in about 4 weeks (around 04/18/2023) for f/u.   Wellington Hampshire, MD

## 2023-03-21 NOTE — Patient Instructions (Signed)
Welcome to [facility] at [facility]! It was a pleasure meeting you today.  As discussed, Please schedule a 1 month follow up visit today.  PLEASE NOTE:  If you had any LAB tests please let us know if you have not heard back within a few days. You may see your results on MyChart before we have a chance to review them but we will give you a call once they are reviewed by us. If we ordered any REFERRALS today, please let us know if you have not heard from their office within the next week.  Let us know through MyChart if you are needing REFILLS, or have your pharmacy send us the request. You can also use MyChart to communicate with me or any office staff.  Please try these tips to maintain a healthy lifestyle:  Eat most of your calories during the day when you are active. Eliminate processed foods including packaged sweets (pies, cakes, cookies), reduce intake of potatoes, white bread, white pasta, and white rice. Look for whole grain options, oat flour or almond flour.  Each meal should contain half fruits/vegetables, one quarter protein, and one quarter carbs (no bigger than a computer mouse).  Cut down on sweet beverages. This includes juice, soda, and sweet tea. Also watch fruit intake, though this is a healthier sweet option, it still contains natural sugar! Limit to 3 servings daily.  Drink at least 1 glass of water with each meal and aim for at least 8 glasses per day  Exercise at least 150 minutes every week.   

## 2023-03-22 LAB — HEPATITIS C ANTIBODY: Hepatitis C Ab: NONREACTIVE

## 2023-03-22 LAB — HIV ANTIBODY (ROUTINE TESTING W REFLEX): HIV 1&2 Ab, 4th Generation: NONREACTIVE

## 2023-03-25 NOTE — Progress Notes (Signed)
Labs okay except: 1.A1C(3 month average of sugars) is elevated.  This is considered PreDiabetes.  Work on diet-decrease sugars and starches and aim for 30 minutes of exercise 5 days/week to prevent progression to diabetes 2.  Cholesterol is a bit elevated-of course, continue working on diet/exercise.  However, consider taking medications again.  If agreeable, Lipitor 20 mg daily and repeat liver, LFTs in 3 months.  If she chooses not to do meds, of course continue above and suggest repeat in 6 months but could do at her annual in 1 year

## 2023-04-02 ENCOUNTER — Telehealth: Payer: Self-pay | Admitting: *Deleted

## 2023-04-02 ENCOUNTER — Other Ambulatory Visit: Payer: Self-pay | Admitting: *Deleted

## 2023-04-02 DIAGNOSIS — Z1211 Encounter for screening for malignant neoplasm of colon: Secondary | ICD-10-CM

## 2023-04-02 NOTE — Telephone Encounter (Signed)
Left detailed message informing patient that I called [employer] and they stated her kit was delivered 03/27/23 to the PO Box. Patient advised to call office if she did not receive it or she can call [employer] directly to get another kit sent out.

## 2023-04-18 ENCOUNTER — Ambulatory Visit: Payer: Commercial Managed Care - PPO | Admitting: Family Medicine

## 2023-04-22 LAB — COLOGUARD: COLOGUARD: NEGATIVE

## 2023-04-27 ENCOUNTER — Ambulatory Visit: Payer: Commercial Managed Care - PPO | Admitting: Family Medicine

## 2023-05-01 ENCOUNTER — Ambulatory Visit (INDEPENDENT_AMBULATORY_CARE_PROVIDER_SITE_OTHER): Payer: Commercial Managed Care - PPO | Admitting: Family Medicine

## 2023-05-01 ENCOUNTER — Encounter: Payer: Self-pay | Admitting: Family Medicine

## 2023-05-01 VITALS — BP 120/70 | HR 82 | Temp 98.7°F | Resp 18 | Ht 64.0 in | Wt 160.4 lb

## 2023-05-01 DIAGNOSIS — R2233 Localized swelling, mass and lump, upper limb, bilateral: Secondary | ICD-10-CM | POA: Diagnosis not present

## 2023-05-01 DIAGNOSIS — E782 Mixed hyperlipidemia: Secondary | ICD-10-CM

## 2023-05-01 DIAGNOSIS — R7303 Prediabetes: Secondary | ICD-10-CM | POA: Diagnosis not present

## 2023-05-01 NOTE — Progress Notes (Signed)
   Subjective:     Patient ID: [name], female    DOB: 06/24/1971, 52 y.o.   MRN: [id number]  Chief Complaint  Patient presents with   Follow-up    4 week follow-up Lymph nodes under both arms, possibly swollen    HPI-68min late  RLQ pain/discomfort-saw gynecology.  Ordered colonoscopy lumps B axilla.  LOng time, left is growing.  Mamm >58mo at [facility] axilla.  Told "nothing to do". No sweats.   Anemia-history of.  Normal now.  Cologard neg PreDM-working on diet and exercise  feeling great.  HLD-trigs elevated 227 LDL 153.  Working on diet/exercise  Health Maintenance Due  Topic Date Due   PAP SMEAR-Modifier  Never done   COLONOSCOPY (Pts 45-46yrs Insurance coverage will need to be confirmed)  Never done    Past Medical History:  Diagnosis Date   Irregular heart beat    PVCs, PACs   Stroke ([facility]) 07/24/2021    Past Surgical History:  Procedure Laterality Date   BREAST SURGERY     tumor removed     Current Outpatient Medications:    MAGNESIUM PO, Take by mouth daily., Disp: , Rfl:    Multiple Vitamin (MULTIVITAMIN) tablet, Take 1 tablet by mouth daily., Disp: , Rfl:    Vitamin D-Vitamin K (K2 PLUS D3 PO), Take by mouth daily., Disp: , Rfl:   Allergies  Allergen Reactions   Amoxicillin-Pot Clavulanate     Other reaction(s): Other (See Comments)  Numbness  Tingling  Other Reaction(s): Other (See Comments)  Tingling, Other reaction(s): Other (See Comments), Numbness, Tingling   Levofloxacin Diarrhea   Cefuroxime Axetil Other (See Comments) and Rash    Tingling  Other Reaction(s): Other (See Comments)  Tingling, Tingling   ROS neg/noncontributory except as noted HPI/below      Objective:     BP 120/70   Pulse 82   Temp 98.7 F (37.1 C) (Temporal)   Resp 18   Ht 5\' 4"  (1.626 m)   Wt 160 lb 6 oz (72.7 kg)   LMP 04/28/2023 (Exact Date)   SpO2 96%   BMI 27.53 kg/m  Wt Readings from Last 3 Encounters:  05/01/23 160  lb 6 oz (72.7 kg)  03/21/23 168 lb (76.2 kg)  10/10/21 151 lb (68.5 kg)    Physical Exam   Gen: WDWN NAD HEENT: NCAT, conjunctiva not injected, sclera nonicteric EXT:  no edema MSK: no gross abnormalities.  NEURO: A&O x3.  CN II-XII intact.  PSYCH: normal mood. Good eye contact B axilla-single, approx 2.5-3cm, mobile, non-tender, soft, nondiscript nodule.  Discussed labs in detail     Assessment & Plan:  Mixed hyperlipidemia -     Lipid panel; Future  Prediabetes -     Hemoglobin A1c; Future  Mass of both axillae -     Ambulatory referral to General Surgery   Mixed HLD-new.  Patient working on diet/exercise.  Reck 2 months.  Discussed etiol  PreDM-new diagnosis.  Working on diet/exercise.  Reck 2 months.   B axillary masses-per patient, left is enlarging.  Suspect fatty deposit.  Will refer surgeon.  Advised to bring copy of ultrasound from 6 months ago.   Angelena Sole, MD

## 2023-05-01 NOTE — Patient Instructions (Signed)
Referral placed to [facility] [place] [facility] [phone].   Bring copy of ultrasound to appointment

## 2023-06-22 ENCOUNTER — Encounter: Payer: Self-pay | Admitting: Family Medicine

## 2023-06-22 ENCOUNTER — Ambulatory Visit (INDEPENDENT_AMBULATORY_CARE_PROVIDER_SITE_OTHER): Payer: Commercial Managed Care - PPO | Admitting: Family Medicine

## 2023-06-22 VITALS — BP 102/70 | HR 88 | Temp 98.6°F | Resp 16 | Ht 64.0 in | Wt 158.4 lb

## 2023-06-22 DIAGNOSIS — R7303 Prediabetes: Secondary | ICD-10-CM | POA: Diagnosis not present

## 2023-06-22 DIAGNOSIS — R42 Dizziness and giddiness: Secondary | ICD-10-CM | POA: Diagnosis not present

## 2023-06-22 DIAGNOSIS — E782 Mixed hyperlipidemia: Secondary | ICD-10-CM

## 2023-06-22 DIAGNOSIS — M79672 Pain in left foot: Secondary | ICD-10-CM | POA: Diagnosis not present

## 2023-06-22 LAB — COMPREHENSIVE METABOLIC PANEL
ALT: 10 U/L (ref 0–35)
AST: 13 U/L (ref 0–37)
Albumin: 4.3 g/dL (ref 3.5–5.2)
Alkaline Phosphatase: 60 U/L (ref 39–117)
BUN: 14 mg/dL (ref 6–23)
CO2: 28 mEq/L (ref 19–32)
Calcium: 9.4 mg/dL (ref 8.4–10.5)
Chloride: 105 mEq/L (ref 96–112)
Creatinine, Ser: 0.62 mg/dL (ref 0.40–1.20)
GFR: 103.7 mL/min (ref >60.00)
Glucose, Bld: 91 mg/dL (ref 70–99)
Potassium: 4.1 mEq/L (ref 3.5–5.1)
Sodium: 139 mEq/L (ref 135–145)
Total Bilirubin: 0.4 mg/dL (ref 0.2–1.2)
Total Protein: 6.9 g/dL (ref 6.0–8.3)

## 2023-06-22 LAB — CBC WITH DIFFERENTIAL/PLATELET
Basophils Absolute: 0 10*3/uL (ref 0.0–0.1)
Basophils Relative: 0.6 % (ref 0.0–3.0)
Eosinophils Absolute: 0.1 10*3/uL (ref 0.0–0.7)
Eosinophils Relative: 1.6 % (ref 0.0–5.0)
HCT: 35.5 % — ABNORMAL LOW (ref 36.0–46.0)
Hemoglobin: 11.5 g/dL — ABNORMAL LOW (ref 12.0–15.0)
Lymphocytes Relative: 30.3 % (ref 12.0–46.0)
Lymphs Abs: 1.7 10*3/uL (ref 0.7–4.0)
MCHC: 32.3 g/dL (ref 30.0–36.0)
MCV: 84.7 fl (ref 78.0–100.0)
Monocytes Absolute: 0.3 10*3/uL (ref 0.1–1.0)
Monocytes Relative: 6.2 % (ref 3.0–12.0)
Neutro Abs: 3.4 10*3/uL (ref 1.4–7.7)
Neutrophils Relative %: 61.3 % (ref 43.0–77.0)
Platelets: 348 10*3/uL (ref 150.0–400.0)
RBC: 4.2 Mil/uL (ref 3.87–5.11)
RDW: 14 % (ref 11.5–15.5)
WBC: 5.5 10*3/uL (ref 4.0–10.5)

## 2023-06-22 LAB — HEMOGLOBIN A1C: Hgb A1c MFr Bld: 5.5 % (ref 4.6–6.5)

## 2023-06-22 LAB — LIPID PANEL
Cholesterol: 184 mg/dL (ref 0–200)
HDL: 49.9 mg/dL (ref >39.00)
LDL Cholesterol: 118 mg/dL — ABNORMAL HIGH (ref 0–99)
NonHDL: 134.45
Total CHOL/HDL Ratio: 4
Triglycerides: 81 mg/dL (ref 0.0–149.0)
VLDL: 16.2 mg/dL (ref 0.0–40.0)

## 2023-06-22 LAB — URIC ACID: Uric Acid, Serum: 3 mg/dL (ref 2.4–7.0)

## 2023-06-22 NOTE — Patient Instructions (Signed)
It was very nice to see you today!  Keep tract of things.   Eat as we discussed.  1 electrolyte dose/day.   PLEASE NOTE:  If you had any lab tests please let us know if you have not heard back within a few days. You may see your results on MyChart before we have a chance to review them but we will give you a call once they are reviewed by [place]. If we ordered any referrals today, please let us know if you have not heard from their office within the next week.   Please try these tips to maintain a healthy lifestyle:  Eat most of your calories during the day when you are active. Eliminate processed foods including packaged sweets (pies, cakes, cookies), reduce intake of potatoes, white bread, white pasta, and white rice. Look for whole grain options, oat flour or almond flour.  Each meal should contain half fruits/vegetables, one quarter protein, and one quarter carbs (no bigger than a computer mouse).  Cut down on sweet beverages. This includes juice, soda, and sweet tea. Also watch fruit intake, though this is a healthier sweet option, it still contains natural sugar! Limit to 3 servings daily.  Drink at least 1 glass of water with each meal and aim for at least 8 glasses per day  Exercise at least 150 minutes every week.

## 2023-06-22 NOTE — Progress Notes (Signed)
Subjective:     Patient ID: [name], female    DOB: Jul 12, 1971, 52 y.o.   MRN: [id number]  Chief Complaint  Patient presents with   Dizziness    Had a episode where she felt lightheaded 10 days ago, was outside, but then had to go back in Had another episode last week, felt better after eating, was nauseated Fasting for blood work    HPI  PreDM-working on diet/exercise Mixed HLD-working on diet/exercise About 10d ago, lightheaded(was walking outdoors), went back in, drank water, rested and better after .  Can't recall if had eaten.  No CP, etc, just "off"(has had before.   Then again last wk-was different and nausea, ate and better-happened twice.woke up like that.  Recurred 2 hrs after lunch, ate snack and better. Not since. No soda,juice, dessert.  Used to drink a lot of regular soda.  Drinks plenty of water.  Some heat in toe L and now more in foot.  No FH gout. Bad period-diarrhea, vomited. And bad pain. Occ missed menses. Saw gyn-occ RLQ pain-w/menses.  Did u/s and small.  Dr. Orie Rout  Health Maintenance Due  Topic Date Due   PAP SMEAR-Modifier  Never done    Past Medical History:  Diagnosis Date   Irregular heart beat    PVCs, PACs   Stroke ([facility]) 07/24/2021    Past Surgical History:  Procedure Laterality Date   BREAST SURGERY     tumor removed     Current Outpatient Medications:    MAGNESIUM PO, Take by mouth daily., Disp: , Rfl:    Multiple Vitamin (MULTIVITAMIN) tablet, Take 1 tablet by mouth daily., Disp: , Rfl:    Vitamin D-Vitamin K (K2 PLUS D3 PO), Take by mouth daily., Disp: , Rfl:   Allergies  Allergen Reactions   Amoxicillin-Pot Clavulanate     Other reaction(s): Other (See Comments)  Numbness  Tingling  Other Reaction(s): Other (See Comments)  Tingling, Other reaction(s): Other (See Comments), Numbness, Tingling   Levofloxacin Diarrhea   Cefuroxime Axetil Other (See Comments) and Rash    Tingling  Other  Reaction(s): Other (See Comments)  Tingling, Tingling   ROS neg/noncontributory except as noted HPI/below      Objective:     BP 102/70   Pulse 88   Temp 98.6 F (37 C) (Temporal)   Resp 16   Ht 5\' 4"  (1.626 m)   Wt 158 lb 6 oz (71.8 kg)   LMP 06/21/2023 (Exact Date)   SpO2 98%   BMI 27.19 kg/m  Wt Readings from Last 3 Encounters:  06/22/23 158 lb 6 oz (71.8 kg)  05/01/23 160 lb 6 oz (72.7 kg)  03/21/23 168 lb (76.2 kg)    Physical Exam   Gen: WDWN NAD HEENT: NCAT, conjunctiva not injected, sclera nonicteric NECK:  supple, no thyromegaly, no nodes, no carotid bruits CARDIAC: RRR, S1S2+, no murmur.  LUNGS: CTAB. No wheezes ABDOMEN:  BS+, soft, NTND, No HSM, no masses EXT:  no edema MSK: no gross abnormalities.  NEURO: A&O x3.  CN II-XII intact.  PSYCH: normal mood. Good eye contact     Assessment & Plan:  Prediabetes -     Comprehensive metabolic panel -     Hemoglobin A1c  Mixed hyperlipidemia -     Lipid panel -     Comprehensive metabolic panel  Left foot pain -     Uric acid -     CBC with Differential/Platelet  Dizziness -  Comprehensive metabolic panel -     CBC with Differential/Platelet   Dizziness-?dehydration, heat, hypoglycemia, other.  Different sensations-2 sounded like low sugar.  Other, "my norm".   Check labs.  Add electrolytes once daily.  Monitor PreDM-working on diet/exercise.  Check labs Mixed HLD-working on diet/exercise.  Check labs L foot pain-pt concerned w/gout-check uric acid.    Return in about 3 months (around 09/22/2023) for preDM, HLD.  Angelena Sole, MD

## 2023-06-24 NOTE — Progress Notes (Signed)
Awesome!  Sugars much better and cholesterol much better.  Keep up the great work but adjust diet as we discussed.  Some mild anemia-are periods heavy?.  Take vitamin w/iron daily.  reck cbcd,iron studies,B12 in 1 month.

## 2023-06-25 ENCOUNTER — Other Ambulatory Visit: Payer: Self-pay

## 2023-06-25 DIAGNOSIS — E782 Mixed hyperlipidemia: Secondary | ICD-10-CM

## 2023-06-25 DIAGNOSIS — D500 Iron deficiency anemia secondary to blood loss (chronic): Secondary | ICD-10-CM

## 2023-07-02 ENCOUNTER — Other Ambulatory Visit: Payer: Commercial Managed Care - PPO

## 2023-07-03 ENCOUNTER — Telehealth: Payer: Self-pay | Admitting: Family Medicine

## 2023-07-03 NOTE — Telephone Encounter (Signed)
Prescription Request  07/03/2023  LOV: 06/22/2023  What is the name of the medication or equipment? Aczone 7.5 % gel   States was prescribed routinely by former pcp Have you contacted your pharmacy to request a refill? Yes   Which pharmacy would you like this sent to?  [id number] [place], [place] - [address] [place] [address] Phone: [phone] Fax: [phone]    Patient notified that their request is being sent to the clinical staff for review and that they should receive a response within 2 business days.   Please advise at Mobile [phone] (mobile)

## 2023-07-03 NOTE — Telephone Encounter (Signed)
Please see message below and advise.

## 2023-07-04 ENCOUNTER — Other Ambulatory Visit: Payer: Self-pay | Admitting: Family Medicine

## 2023-07-04 MED ORDER — DAPSONE 7.5 % EX GEL: 1.0000 | Freq: Every day | CUTANEOUS | 3 refills | Status: DC

## 2023-07-05 ENCOUNTER — Ambulatory Visit: Payer: Commercial Managed Care - PPO | Admitting: Family Medicine

## 2023-07-26 ENCOUNTER — Other Ambulatory Visit: Payer: Commercial Managed Care - PPO

## 2023-07-30 ENCOUNTER — Other Ambulatory Visit: Payer: Self-pay | Admitting: *Deleted

## 2023-07-30 ENCOUNTER — Other Ambulatory Visit (INDEPENDENT_AMBULATORY_CARE_PROVIDER_SITE_OTHER): Payer: Commercial Managed Care - PPO

## 2023-07-30 DIAGNOSIS — E559 Vitamin D deficiency, unspecified: Secondary | ICD-10-CM | POA: Diagnosis not present

## 2023-07-30 DIAGNOSIS — E782 Mixed hyperlipidemia: Secondary | ICD-10-CM | POA: Diagnosis not present

## 2023-07-30 DIAGNOSIS — D500 Iron deficiency anemia secondary to blood loss (chronic): Secondary | ICD-10-CM

## 2023-07-30 DIAGNOSIS — E54 Ascorbic acid deficiency: Secondary | ICD-10-CM

## 2023-07-30 LAB — UNLABELED: Test Ordered On Req: 929

## 2023-07-30 LAB — CBC WITH DIFFERENTIAL/PLATELET
Basophils Absolute: 0 10*3/uL (ref 0.0–0.1)
Basophils Relative: 0.6 % (ref 0.0–3.0)
Eosinophils Absolute: 0.1 10*3/uL (ref 0.0–0.7)
Eosinophils Relative: 1.7 % (ref 0.0–5.0)
HCT: 36.2 % (ref 36.0–46.0)
Hemoglobin: 11.8 g/dL — ABNORMAL LOW (ref 12.0–15.0)
Lymphocytes Relative: 29.6 % (ref 12.0–46.0)
Lymphs Abs: 1.6 10*3/uL (ref 0.7–4.0)
MCHC: 32.7 g/dL (ref 30.0–36.0)
MCV: 85.9 fl (ref 78.0–100.0)
Monocytes Absolute: 0.4 10*3/uL (ref 0.1–1.0)
Monocytes Relative: 6.8 % (ref 3.0–12.0)
Neutro Abs: 3.3 10*3/uL (ref 1.4–7.7)
Neutrophils Relative %: 61.3 % (ref 43.0–77.0)
Platelets: 323 10*3/uL (ref 150.0–400.0)
RBC: 4.21 Mil/uL (ref 3.87–5.11)
RDW: 13.7 % (ref 11.5–15.5)
WBC: 5.4 10*3/uL (ref 4.0–10.5)

## 2023-07-30 LAB — LIPID PANEL
Cholesterol: 209 mg/dL — ABNORMAL HIGH (ref 0–200)
HDL: 57.5 mg/dL (ref >39.00)
LDL Cholesterol: 123 mg/dL — ABNORMAL HIGH (ref 0–99)
NonHDL: 151.1
Total CHOL/HDL Ratio: 4
Triglycerides: 139 mg/dL (ref 0.0–149.0)
VLDL: 27.8 mg/dL (ref 0.0–40.0)

## 2023-08-02 NOTE — Progress Notes (Signed)
Not sure why cholesterol done.   Hgb stable.  Issue w/lab so waiting to see if iron/B12/C get run

## 2023-08-14 ENCOUNTER — Telehealth (INDEPENDENT_AMBULATORY_CARE_PROVIDER_SITE_OTHER): Payer: Commercial Managed Care - PPO | Admitting: Family Medicine

## 2023-08-14 VITALS — Ht 64.0 in | Wt 156.0 lb

## 2023-08-14 DIAGNOSIS — U071 COVID-19: Secondary | ICD-10-CM | POA: Diagnosis not present

## 2023-08-14 LAB — CHANTILLY LABS MISC

## 2023-08-14 MED ORDER — NIRMATRELVIR/RITONAVIR (PAXLOVID)TABLET
3.0000 | ORAL_TABLET | Freq: Two times a day (BID) | ORAL | 0 refills | Status: AC
Start: 2023-08-14 — End: 2023-08-19

## 2023-08-14 NOTE — Assessment & Plan Note (Signed)
Reviewed home care instructions for COVID, including rest, pushing fluids, and OTC medications as needed for symptom relief. I recommend she not start on an aspirin at this point, despite her prior history of stroke related to COVID. I will prescribe Paxlovid. Advised self-isolation at home until 24 hours after fever resolved and symptoms improve. Continue to wear a mask around others for an additional 5 days. If symptoms, esp, dyspnea develops/worsens, recommend in-person evaluation at either an urgent care or the emergency room.

## 2023-08-14 NOTE — Progress Notes (Signed)
[facility] [address] [place] [address] Dept: [phone] Dept Fax: [phone]  Virtual Video Visit  I connected with [name] on 08/14/23 at  4:00 PM EDT by a video enabled telemedicine application and verified that I am speaking with the correct person using two identifiers.  Location patient: Work Location provider: Clinic Persons participating in the virtual visit: Patient, Provider  I discussed the limitations of evaluation and management by telemedicine and the availability of in person appointments. The patient expressed understanding and agreed to proceed.  Chief Complaint  Patient presents with   Sinusitis    C/o having stuffy nose, body aches x 1 day.  Positive home covid test.     SUBJECTIVE:  HPI: [name] is a 52 y.o. female who presents with a 24-hour history of fatigue, body aches, and stuffy nose. She did go to work this morning. As her symptoms had progressed during the day, she decided to do a COVID test, which was positive. She notes she had COVID 2 years ago. This episode was complicated by having a stroke, including an expressive aphasia, which did not fully resolve until Jan. 2024.  There are no problems to display for this patient.  Past Surgical History:  Procedure Laterality Date   BREAST SURGERY     tumor removed   Family History  Problem Relation Age of Onset   Hypertension Mother    Hyperlipidemia Mother    Healthy Mother    Stroke Father 78 - 74   Hypertension Father    Hyperlipidemia Father    Healthy Father    Social History   Tobacco Use   Smoking status: Never   Smokeless tobacco: Never  Vaping Use   Vaping status: Never Used  Substance Use Topics   Alcohol use: No   Drug use: Never    Current Outpatient Medications:    Dapsone (ACZONE) 7.5 % GEL, Apply 1 Film topically daily at 12 noon., Disp: 60 g, Rfl: 3   ferrous sulfate (FER-IN-SOL) 75 (15 Fe)  MG/ML SOLN, Take by mouth., Disp: , Rfl:    MAGNESIUM PO, Take by mouth daily., Disp: , Rfl:    Multiple Vitamin (MULTIVITAMIN) tablet, Take 1 tablet by mouth daily., Disp: , Rfl:    nirmatrelvir/ritonavir (PAXLOVID) 20 x 150 MG & 10 x 100MG  TABS, Take 3 tablets by mouth 2 (two) times daily for 5 days. (Take nirmatrelvir 150 mg two tablets twice daily for 5 days and ritonavir 100 mg one tablet twice daily for 5 days) Patient GFR is 103, Disp: 30 tablet, Rfl: 0   Vitamin D-Vitamin K (K2 PLUS D3 PO), Take by mouth daily., Disp: , Rfl:  Allergies  Allergen Reactions   Amoxicillin-Pot Clavulanate     Other reaction(s): Other (See Comments)  Numbness  Tingling  Other Reaction(s): Other (See Comments)  Tingling, Other reaction(s): Other (See Comments), Numbness, Tingling   Levofloxacin Diarrhea   Cefuroxime Axetil Other (See Comments) and Rash    Tingling  Other Reaction(s): Other (See Comments)  Tingling, Tingling   ROS: See pertinent positives and negatives per HPI.  OBSERVATIONS/OBJECTIVE:  VITALS per patient if applicable: Today's Vitals   08/14/23 1600  Weight: 156 lb (70.8 kg)  Height: 5\' 4"  (1.626 m)   Body mass index is 26.78 kg/m.   GENERAL: Alert and oriented. Appears well and in no acute distress. HEENT: Atraumatic. Eyes clear. No obvious abnormalities on inspection of external nose and ears. NECK: Normal  movements of the head and neck. LUNGS: On inspection, no signs of respiratory distress. Breathing rate appears normal. No obvious gross SOB, gasping or wheezing, and no conversational dyspnea. CV: No obvious cyanosis. MS: Moves all visible extremities without noticeable abnormality. PSYCH/NEURO: Pleasant and cooperative. No obvious depression or anxiety. Speech and thought processing grossly intact.  ASSESSMENT AND PLAN:  Problem List Items Addressed This Visit       Other   COVID-19 - Primary    Reviewed home care instructions for COVID, including rest,  pushing fluids, and OTC medications as needed for symptom relief. I recommend she not start on an aspirin at this point, despite her prior history of stroke related to COVID. I will prescribe Paxlovid. Advised self-isolation at home until 24 hours after fever resolved and symptoms improve. Continue to wear a mask around others for an additional 5 days. If symptoms, esp, dyspnea develops/worsens, recommend in-person evaluation at either an urgent care or the emergency room.       Relevant Medications   nirmatrelvir/ritonavir (PAXLOVID) 20 x 150 MG & 10 x 100MG  TABS     I discussed the assessment and treatment plan with the patient. The patient was provided an opportunity to ask questions and all were answered. The patient agreed with the plan and demonstrated an understanding of the instructions.   The patient was advised to call back or seek an in-person evaluation if the symptoms worsen or if the condition fails to improve as anticipated.  Return if symptoms worsen or fail to improve.   Loyola Mast, MD

## 2023-08-15 NOTE — Progress Notes (Signed)
I apologize.  This is the longest I've ever seen the lab take-iron studies are all fine as well.  She can continue the iron for now if wants for few weeks if helping her feel better.

## 2023-09-26 ENCOUNTER — Ambulatory Visit: Payer: Commercial Managed Care - PPO | Admitting: Family Medicine

## 2023-10-29 ENCOUNTER — Ambulatory Visit: Payer: Commercial Managed Care - PPO | Admitting: Family Medicine

## 2023-11-21 ENCOUNTER — Ambulatory Visit: Payer: Commercial Managed Care - PPO | Admitting: Family

## 2023-11-21 ENCOUNTER — Ambulatory Visit (INDEPENDENT_AMBULATORY_CARE_PROVIDER_SITE_OTHER): Payer: Commercial Managed Care - PPO

## 2023-11-21 ENCOUNTER — Ambulatory Visit: Payer: Commercial Managed Care - PPO | Admitting: Sports Medicine

## 2023-11-21 VITALS — BP 128/84 | Ht 64.0 in | Wt 153.0 lb

## 2023-11-21 DIAGNOSIS — M79641 Pain in right hand: Secondary | ICD-10-CM

## 2023-11-21 MED ORDER — MELOXICAM 15 MG PO TABS: 15.0000 mg | ORAL_TABLET | Freq: Every day | ORAL | 0 refills | Status: DC

## 2023-11-21 NOTE — Progress Notes (Signed)
    [name] D.[facility] [address] [place] [address] Phone: [phone]   Assessment and Plan:     1. Right hand pain -Acute, uncomplicated, initial sports medicine visit - Most consistent with sprain of right fourth PIP occurring during an altercation with a large dog yesterday, 11/20/2023 - X-ray obtained in clinic.  My interpretation: No acute fracture or dislocation. - Start meloxicam 15 mg daily x2 weeks.  If still having pain after 2 weeks, complete 3rd-week of NSAID. May use remaining NSAID as needed once daily for pain control.  Do not to use additional over-the-counter NSAIDs (ibuprofen, naproxen, Advil, Aleve) while taking prescription NSAIDs.  May use Tylenol [phone] mg 2 to 3 times a day for breakthrough pain. - Recommend buddy taping third and fourth digits to provide support to PIP - Recommend icing for the next 48 hours to decrease swelling  Other orders - meloxicam (MOBIC) 15 MG tablet; Take 1 tablet (15 mg total) by mouth daily.    Pertinent previous records reviewed include none  Follow Up: As needed   Subjective:   I, Wilford Grist, am serving as a scribe for Dr. Richardean Sale  Chief Complaint: Right ring finger pain  HPI:   11/21/23 Pain in R hand middle and ring fingers.  Broke up a dog fight last night. Dog lease wrapped around her hand. Able to make fist but there is a lot of pressure. Denies any numbness or tingling   Relevant Historical Information: None pertinent  Additional pertinent review of systems negative.   Current Outpatient Medications:    Dapsone (ACZONE) 7.5 % GEL, Apply 1 Film topically daily at 12 noon., Disp: 60 g, Rfl: 3   ferrous sulfate (FER-IN-SOL) 75 (15 Fe) MG/ML SOLN, Take by mouth., Disp: , Rfl:    MAGNESIUM PO, Take by mouth daily., Disp: , Rfl:    meloxicam (MOBIC) 15 MG tablet, Take 1 tablet (15 mg total) by mouth daily., Disp: 30 tablet, Rfl: 0   Multiple Vitamin  (MULTIVITAMIN) tablet, Take 1 tablet by mouth daily., Disp: , Rfl:    Vitamin D-Vitamin K (K2 PLUS D3 PO), Take by mouth daily., Disp: , Rfl:    Objective:     Vitals:   11/21/23 1340  BP: 128/84  Weight: 153 lb (69.4 kg)  Height: 5\' 4"  (1.626 m)      Body mass index is 26.26 kg/m.    Physical Exam:    General: Appears well, nad, nontoxic and pleasant Neuro:sensation intact, strength is 5/5 in upper extremities, muscle tone wnl Skin:no susupicious lesions or rashes  Right hand/wrist:   No deformity Mild swelling of fourth PIP with TTP and pain with valgus and varus deviation Wrist ROM  Ext 90, flexion70, radial/ulnar deviation 30 nttp over the snuff box, dorsal carpals, volar carpals, radial styloid, ulnar styloid, 1st mcp, tfcc Negative Tinel's Negative finklestein Neg tfcc bounce test No finger pain with resisted ext, flex     Electronically signed by:  [name] D.[facility] 1:57 PM 11/21/23

## 2023-11-21 NOTE — Patient Instructions (Signed)
-   Start meloxicam 15 mg daily x2 weeks.  If still having pain after 2 weeks, complete 3rd-week of NSAID. May use remaining NSAID as needed once daily for pain control.  Do not to use additional over-the-counter NSAIDs (ibuprofen, naproxen, Advil, Aleve) while taking prescription NSAIDs.  May use Tylenol [phone] mg 2 to 3 times a day for breakthrough pain. Recommend buddy tape Ice for next 48 hours See me as needed

## 2023-11-27 ENCOUNTER — Encounter: Payer: Self-pay | Admitting: Family Medicine

## 2023-11-27 ENCOUNTER — Ambulatory Visit: Payer: Commercial Managed Care - PPO | Admitting: Family Medicine

## 2023-11-27 VITALS — BP 118/82 | HR 67 | Temp 98.1°F | Resp 16 | Ht 64.0 in | Wt 152.1 lb

## 2023-11-27 DIAGNOSIS — E559 Vitamin D deficiency, unspecified: Secondary | ICD-10-CM | POA: Insufficient documentation

## 2023-11-27 DIAGNOSIS — E782 Mixed hyperlipidemia: Secondary | ICD-10-CM | POA: Insufficient documentation

## 2023-11-27 DIAGNOSIS — R7303 Prediabetes: Secondary | ICD-10-CM

## 2023-11-27 DIAGNOSIS — R42 Dizziness and giddiness: Secondary | ICD-10-CM

## 2023-11-27 DIAGNOSIS — Z8673 Personal history of transient ischemic attack (TIA), and cerebral infarction without residual deficits: Secondary | ICD-10-CM

## 2023-11-27 DIAGNOSIS — E538 Deficiency of other specified B group vitamins: Secondary | ICD-10-CM | POA: Diagnosis not present

## 2023-11-27 LAB — HM MAMMOGRAPHY

## 2023-11-27 NOTE — Assessment & Plan Note (Signed)
History of possible TIA after COVID.  Reviewed neurology notes.  Patient would like referral to neurologist in [place].  She was told to follow-up annually for MRIs.  She is getting 1 in [place] this afternoon

## 2023-11-27 NOTE — Progress Notes (Signed)
Subjective:     Patient ID: [name], female    DOB: 1971/05/03, 52 y.o.   MRN: [id number]  Chief Complaint  Patient presents with   Medical Management of Chronic Issues    Follow-up on predm, hld Feel shaky started over 1 month ago, feel like a vibration in brain and body shaking Went to see neurology last week for dizziness, has MRI coming up soon    HPI  PreDM-working on diet/exercise HLD-diet/exercise.  Has lost 20# in 6 mo. Shakey-since CVA.  Feels vibration in head.  Saw neuro last wk.  Getting MRI.  Also, feels in whole body and "hypersensitive".  Uncomfortable.  Going on about every 6 mo and back again.  Feels "sick" when occurs.  Thinks lasts about 1 mo at a time.  Got covid in August again Taking B vits and 5http  Health Maintenance Due  Topic Date Due   Cervical Cancer Screening (HPV/Pap Cotest)  Never done    Past Medical History:  Diagnosis Date   Irregular heart beat    PVCs, PACs   Stroke (HCC) 07/24/2021    Past Surgical History:  Procedure Laterality Date   BREAST SURGERY     tumor removed     Current Outpatient Medications:    ferrous sulfate (FER-IN-SOL) 75 (15 Fe) MG/ML SOLN, Take by mouth., Disp: , Rfl:    MAGNESIUM PO, Take by mouth daily., Disp: , Rfl:    Multiple Vitamin (MULTIVITAMIN) tablet, Take 1 tablet by mouth daily., Disp: , Rfl:    Vitamin D-Vitamin K (K2 PLUS D3 PO), Take by mouth daily., Disp: , Rfl:   Allergies  Allergen Reactions   Amoxicillin-Pot Clavulanate     Other reaction(s): Other (See Comments)  Numbness  Tingling  Other Reaction(s): Other (See Comments)  Tingling, Other reaction(s): Other (See Comments), Numbness, Tingling   Levofloxacin Diarrhea   Cefuroxime Axetil Other (See Comments) and Rash    Tingling  Other Reaction(s): Other (See Comments)  Tingling, Tingling   ROS neg/noncontributory except as noted HPI/below      Objective:     BP 118/82   Pulse 67   Temp 98.1 F (36.7 C)  (Temporal)   Resp 16   Ht 5\' 4"  (1.626 m)   Wt 152 lb 2 oz (69 kg)   LMP 09/02/2023 (Exact Date)   SpO2 95%   BMI 26.11 kg/m  Wt Readings from Last 3 Encounters:  11/27/23 152 lb 2 oz (69 kg)  11/21/23 153 lb (69.4 kg)  08/14/23 156 lb (70.8 kg)    Physical Exam   Gen: WDWN NAD HEENT: NCAT, conjunctiva not injected, sclera nonicteric NECK:  supple, no thyromegaly, no nodes, no carotid bruits CARDIAC: RRR, S1S2+, no murmur. DP 2+B LUNGS: CTAB. No wheezes ABDOMEN:  BS+, soft, NTND, No HSM, no masses EXT:  no edema MSK: no gross abnormalities.  NEURO: A&O x3.  CN II-XII intact.  PSYCH: normal mood. Good eye contact  Reviewed neuro note/labs.  Has MRI this afternoon     Assessment & Plan:  Prediabetes Assessment & Plan: Chronic.  Controlled.  Has been working on diet/exercise.  Check labs  Orders: -     Hemoglobin A1c  Mixed hyperlipidemia Assessment & Plan: Chronic.  Patient prefers not taking medications.  Working on diet/exercise.  Orders: -     Lipid panel  Vitamin D3 deficiency Assessment & Plan: Chronic.  Taking supplements.  Check labs  Orders: -     VITAMIN D 25  Hydroxy (Vit-D Deficiency, Fractures)  B12 deficiency Assessment & Plan: Chronic.  Taking supplements.  Check labs to make sure she is absorbing  Orders: -     Vitamin B12  History of cardioembolic cerebrovascular accident (CVA) Assessment & Plan: History of possible TIA after COVID.  Reviewed neurology notes.  Patient would like referral to neurologist in [place].  She was told to follow-up annually for MRIs.  She is getting 1 in [place] this afternoon   Dizziness  Dizziness-intermittent.  Seems to get for about a month every 6 months.  Not sure if these are complex migraines, stress, other.  She is getting an MRI later today.  Advised to start keeping a log.  Return in about 6 months (around 05/27/2024) for annual physical.  Angelena Sole, MD

## 2023-11-27 NOTE — Assessment & Plan Note (Signed)
Chronic.  Patient prefers not taking medications.  Working on diet/exercise.

## 2023-11-27 NOTE — Assessment & Plan Note (Signed)
Chronic.  Taking supplements.  Check labs to make sure she is absorbing

## 2023-11-27 NOTE — Assessment & Plan Note (Signed)
Chronic.  Controlled.  Has been working on diet/exercise.  Check labs

## 2023-11-27 NOTE — Assessment & Plan Note (Signed)
Chronic.  Taking supplements.  Check labs

## 2023-11-27 NOTE — Patient Instructions (Signed)

## 2023-11-30 ENCOUNTER — Encounter: Payer: Self-pay | Admitting: Family Medicine

## 2023-11-30 ENCOUNTER — Other Ambulatory Visit (INDEPENDENT_AMBULATORY_CARE_PROVIDER_SITE_OTHER): Payer: Commercial Managed Care - PPO

## 2023-11-30 ENCOUNTER — Ambulatory Visit: Payer: Commercial Managed Care - PPO | Admitting: Physician Assistant

## 2023-11-30 ENCOUNTER — Ambulatory Visit (INDEPENDENT_AMBULATORY_CARE_PROVIDER_SITE_OTHER): Payer: Commercial Managed Care - PPO | Admitting: Family Medicine

## 2023-11-30 ENCOUNTER — Telehealth: Payer: Self-pay | Admitting: Family Medicine

## 2023-11-30 ENCOUNTER — Other Ambulatory Visit: Payer: Self-pay

## 2023-11-30 ENCOUNTER — Other Ambulatory Visit: Payer: Self-pay | Admitting: *Deleted

## 2023-11-30 VITALS — BP 119/75 | HR 73 | Temp 98.0°F | Resp 18 | Ht 64.0 in | Wt 156.1 lb

## 2023-11-30 DIAGNOSIS — E782 Mixed hyperlipidemia: Secondary | ICD-10-CM

## 2023-11-30 DIAGNOSIS — E538 Deficiency of other specified B group vitamins: Secondary | ICD-10-CM

## 2023-11-30 DIAGNOSIS — D500 Iron deficiency anemia secondary to blood loss (chronic): Secondary | ICD-10-CM

## 2023-11-30 DIAGNOSIS — R7303 Prediabetes: Secondary | ICD-10-CM

## 2023-11-30 DIAGNOSIS — M5442 Lumbago with sciatica, left side: Secondary | ICD-10-CM

## 2023-11-30 LAB — LIPID PANEL
Cholesterol: 182 mg/dL (ref 0–200)
HDL: 51.1 mg/dL (ref >39.00)
LDL Cholesterol: 101 mg/dL — ABNORMAL HIGH (ref 0–99)
NonHDL: 131.19
Total CHOL/HDL Ratio: 4
Triglycerides: 153 mg/dL — ABNORMAL HIGH (ref 0.0–149.0)
VLDL: 30.6 mg/dL (ref 0.0–40.0)

## 2023-11-30 LAB — VITAMIN B12: Vitamin B-12: 1296 pg/mL — ABNORMAL HIGH (ref 211–911)

## 2023-11-30 LAB — HEMOGLOBIN A1C: Hgb A1c MFr Bld: 5.8 % (ref 4.6–6.5)

## 2023-11-30 LAB — VITAMIN D 25 HYDROXY (VIT D DEFICIENCY, FRACTURES): VITD: 45.43 ng/mL (ref 30.00–100.00)

## 2023-11-30 MED ORDER — PREDNISONE 20 MG PO TABS: 40.0000 mg | ORAL_TABLET | Freq: Every day | ORAL | 0 refills | Status: AC

## 2023-11-30 NOTE — Telephone Encounter (Signed)
Final outcome: See PCP within 24 hours. Due to no availability with PCP, pt has been scheduled with Alfredia Ferguson @ 4pm on 11/30/23.    Patient Name First: Nancy Last: Sexton Gender: Female DOB: Apr 12, 1971 Age: 52 Y 16 D Return Phone Number: [phone] (Primary) Address: City/ State/ Zip: [place]  [address] Client [facility] - Administrator, [facility] Provider Ruthine Dose, Ann Contact Type Call Who Is Calling Patient / Member / Family / Caregiver Call Type Triage / Clinical Relationship To Patient Self Return Phone Number [phone] (Primary) Chief Complaint NUMBNESS/TINGLING- sudden on one side of the body or face Reason for Call Symptomatic / Request for Health Information Initial Comment Caller says she pulled a muscle in her back and now is experiencing left leg numbness. Caller sys numbness on her left side and she had a stroke in the past that why she is concerned. Translation No Nurse Assessment Nurse: Clarita Leber, RN, Deborah Date/Time (Eastern Time): 11/30/2023 10:17:13 AM Confirm and document reason for call. If symptomatic, describe symptoms. ---The caller states that she is having gluteal pain and pain shooting down the back of her leg and today she is having foot numbness. History of a stroke. Does the patient have any new or worsening symptoms? ---Yes Will a triage be completed? ---Yes Related visit to physician within the last 2 weeks? ---No Does the PT have any chronic conditions? (i.e. diabetes, asthma, this includes High risk factors for pregnancy, etc.) ---Yes List chronic conditions. ---stroke Is the patient pregnant or possibly pregnant? (Ask all females between the ages of 37-55) ---No Is this a behavioral health or substance abuse call? ---No Guidelines Guideline Title Affirmed Question Affirmed Notes Nurse Date/Time (Eastern Time) Back Pain Numbness in a leg or foot  (i.e., loss of sensation) Womble, RN, Gavin Pound 11/30/2023 10:20:01 AM Disp. Time Lamount Cohen Time) Disposition Final User 11/30/2023 10:12:46 AM Send to Urgent Maryjo Rochester 11/30/2023 10:26:28 AM See PCP within 24 Hours Yes Clarita Leber, RN, Gavin Pound Final Disposition 11/30/2023 10:26:28 AM See PCP within 24 Hours Yes Clarita Leber, RN, Jetty Duhamel Disagree/Comply Comply Caller Understands Yes PreDisposition Call Doctor Care Advice Given Per Guideline SEE PCP WITHIN 24 HOURS: * IF OFFICE WILL BE OPEN: You need to be examined within the next 24 hours. Call your doctor (or NP/PA) when the office opens and make an appointment. * You become worse  Comments User: Alita Chyle, RN Date/Time Lamount Cohen Time): 11/30/2023 10:29:00 AM This nurse called the backline and no answer. The caller was instructed to call the office and see if she can be seen today as outcome was to be seen in 24 hours and office is closed tomorrow. This nurse instructed her to go to UC if the office cannot see her and that she can call back if she worsens. She was also instructed that if she has numbness or other stroke like symptoms in the rest of her body that she needs to call 911 and she verbalized understanding. Referrals REFERRED TO PCP OFFICE

## 2023-11-30 NOTE — Telephone Encounter (Signed)
Noted  

## 2023-11-30 NOTE — Telephone Encounter (Signed)
FYI

## 2023-11-30 NOTE — Addendum Note (Signed)
Addended by: Jobe Gibbon on: 11/30/2023 12:21 PM   Modules accepted: Orders

## 2023-11-30 NOTE — Addendum Note (Signed)
Addended by: Jobe Gibbon on: 11/30/2023 03:40 PM   Modules accepted: Orders

## 2023-11-30 NOTE — Telephone Encounter (Signed)
Due to last minute cancellation, pt has been scheduled with PCP for 11/30/23 @ 3:30 pm.

## 2023-11-30 NOTE — Telephone Encounter (Signed)
FYI: This call has been transferred to triage nurse: Access Nurse. Once the result note has been entered staff can address the message at that time.  Patient called in with the following symptoms:  Red Word: Back pain, leg twitching and left leg numbness x 3 days   Please advise at Mobile [phone] (mobile)  Message is routed to Provider Pool.

## 2023-11-30 NOTE — Patient Instructions (Signed)
Ice, /heat,  icey hot etc.  No ibuprofen while on prednisone.  Tylenol is fine  Psychiatrist Medicine at [facility]  [address] on the 1st floor Phone number [phone]   If bowel/bladder problems-to ER

## 2023-11-30 NOTE — Addendum Note (Signed)
Addended by: Dorris Fetch on: 11/30/2023 01:14 PM   Modules accepted: Orders

## 2023-11-30 NOTE — Progress Notes (Signed)
Subjective:     Patient ID: [name], female    DOB: 03/23/1971, 52 y.o.   MRN: [id number]  Chief Complaint  Patient presents with   Pain    Pain in left glut radiating to ankle, having numbness and difficulty walking Pain started Tuesday evening Has taking ibuprofen for pain    HPI L sciatica-since 12/3 in pm. Had similar about 41mo ago.  Had adhesion in glute.  Chiro worked it out.  Now returned.  No specific injury.  This time, rad down to foot and muscle spasms.  Has seen 3 chiros this wk. They could feel the spasms.  Has been icing and magnesium gel  +tingling, pain, numbness L foot-worse when lies down. Ibu.  When walking, feels some weakness.  No b/b dysfunction.   Overall, less intense than yesterday.  Health Maintenance Due  Topic Date Due   Cervical Cancer Screening (HPV/Pap Cotest)  Never done    Past Medical History:  Diagnosis Date   Irregular heart beat    PVCs, PACs   Stroke (HCC) 07/24/2021    Past Surgical History:  Procedure Laterality Date   BREAST SURGERY     tumor removed     Current Outpatient Medications:    ferrous sulfate (FER-IN-SOL) 75 (15 Fe) MG/ML SOLN, Take by mouth., Disp: , Rfl:    MAGNESIUM PO, Take by mouth daily., Disp: , Rfl:    Multiple Vitamin (MULTIVITAMIN) tablet, Take 1 tablet by mouth daily., Disp: , Rfl:    predniSONE (DELTASONE) 20 MG tablet, Take 2 tablets (40 mg total) by mouth daily with breakfast for 5 days., Disp: 10 tablet, Rfl: 0   Vitamin D-Vitamin K (K2 PLUS D3 PO), Take by mouth daily., Disp: , Rfl:   Allergies  Allergen Reactions   Amoxicillin-Pot Clavulanate     Other reaction(s): Other (See Comments)  Numbness  Tingling  Other Reaction(s): Other (See Comments)  Tingling, Other reaction(s): Other (See Comments), Numbness, Tingling   Levofloxacin Diarrhea   Cefuroxime Axetil Other (See Comments) and Rash    Tingling  Other Reaction(s): Other (See Comments)  Tingling, Tingling   ROS  neg/noncontributory except as noted HPI/below      Objective:     BP 119/75   Pulse 73   Temp 98 F (36.7 C) (Temporal)   Resp 18   Ht 5\' 4"  (1.626 m)   Wt 156 lb 2 oz (70.8 kg)   LMP 09/02/2023 (Exact Date)   SpO2 97%   BMI 26.80 kg/m  Wt Readings from Last 3 Encounters:  11/30/23 156 lb 2 oz (70.8 kg)  11/27/23 152 lb 2 oz (69 kg)  11/21/23 153 lb (69.4 kg)    Physical Exam   Gen: WDWN NAD HEENT: NCAT, conjunctiva not injected, sclera nonicteric EXT:  no edema MSK: no gross abnormalities.  NEURO: A&O x3.  CN II-XII intact.  PSYCH: normal mood. Good eye contact  Back:  can stand on heels/toes/1 leg.  mild TTP. + L SI tenderness. No rash.  MS 5/5 BLE but 4+/5 L dorsi flex(but on repeat, better strength).  DTR 2+ knee L>R, 2+R ankle 1/2+L.  SLR neg B.  Good ROM .  Tender whole glute L     Assessment & Plan:  Acute left-sided low back pain with left-sided sciatica  Mixed hyperlipidemia -     Lipid panel  Other orders -     predniSONE; Take 2 tablets (40 mg total) by mouth daily with breakfast for 5  days.  Dispense: 10 tablet; Refill: 0   L sided LBP w/sciatica.  Had episode about 67m ago, but resolved and not as much nerve irrit.  Reg flags discussed and need to ER.  Will trial prednisone.  Continue tylenol, patches, Mg cream, etc.  Contact info given for sports med.   Return if symptoms worsen or fail to improve.  Angelena Sole, MD

## 2023-12-02 NOTE — Progress Notes (Signed)
B12 and D fine.   A1C(3 month average of sugars) is elevated.  This is considered PreDiabetes.  Work on diet-decrease sugars and starches and aim for 30 minutes of exercise 5 days/week to prevent progression to diabetes . Cholesterol better-continue to work on it

## 2023-12-25 ENCOUNTER — Other Ambulatory Visit: Payer: Self-pay | Admitting: Sports Medicine

## 2024-03-17 ENCOUNTER — Ambulatory Visit (INDEPENDENT_AMBULATORY_CARE_PROVIDER_SITE_OTHER): Admitting: Family Medicine

## 2024-03-17 ENCOUNTER — Ambulatory Visit: Payer: Self-pay

## 2024-03-17 ENCOUNTER — Encounter: Payer: Self-pay | Admitting: Family Medicine

## 2024-03-17 VITALS — BP 111/74 | HR 84 | Temp 97.3°F | Ht 64.0 in | Wt 159.6 lb

## 2024-03-17 DIAGNOSIS — Z8673 Personal history of transient ischemic attack (TIA), and cerebral infarction without residual deficits: Secondary | ICD-10-CM | POA: Diagnosis not present

## 2024-03-17 DIAGNOSIS — R7303 Prediabetes: Secondary | ICD-10-CM

## 2024-03-17 DIAGNOSIS — M79602 Pain in left arm: Secondary | ICD-10-CM | POA: Diagnosis not present

## 2024-03-17 MED ORDER — PREDNISONE 50 MG PO TABS: ORAL_TABLET | ORAL | 0 refills | Status: DC

## 2024-03-17 MED ORDER — GABAPENTIN 100 MG PO CAPS: 100.0000 mg | ORAL_CAPSULE | Freq: Every day | ORAL | 3 refills | Status: DC

## 2024-03-17 NOTE — Progress Notes (Signed)
   [name] is a 53 y.o. female who presents today for an office visit.  Assessment/Plan:  New/Acute Problems: Left arm pain Exam and history consistent with cervical radiculopathy.  No red flag signs or symptoms.  We discussed treatment options.  We will start prednisone.  Will also start low-dose gabapentin.  We discussed potential side effects.  We did discuss referral to PT or sports medicine however she would like to try medications before pursuing either of these options.  She will follow-up with [place] in a few days.  If no improvement would consider imaging versus referral at that time.  We discussed reasons to return to care.  Chronic Problems Addressed Today: Prediabetes Last A1c 5.8-do not think she will have any issues with prednisone burst as above  CVA Due to COVID-19 infection from a couple of years ago.  Still has occasional word finding difficulties but otherwise no lingering sequela.  She did have an MRI few months ago which demonstrated no change compared to her previous 1 from 08/23/2022.    Subjective:  HPI:  See A/P for status of chronic conditions.  Patient is here today with left arm pain.  This started 2 to 3 weeks ago.  No obvious injuries or presenting events.  She does note that she was doing a lot of exercising with lifting weights above her head a few weeks ago and that the pain started after this however did not have any obvious injuries at the time of exercising.  Pain is predominately located in her left shoulder and radiates into her left wrist.  Pain has been coming and going though sometimes it is intense in nature.  She did start developing some numbness and tingling in her left arm yesterday.  Over the last few weeks she has tried over-the-counter medications including Advil without much improvement.  She has also tried using some ointment and massages with some improvement.  She did have a flareup of sciatica several months ago that she saw her PCP for.   Over the last few days symptoms do seem to be worsening.  No weakness.       Objective:  Physical Exam: BP 111/74   Pulse 84   Temp (!) 97.3 F (36.3 C) (Temporal)   Ht 5\' 4"  (1.626 m)   Wt 159 lb 9.6 oz (72.4 kg)   SpO2 96%   BMI 27.40 kg/m   Gen: No acute distress, resting comfortably CV: Regular rate and rhythm with no murmurs appreciated Pulm: Normal work of breathing, clear to auscultation bilaterally with no crackles, wheezes, or rhonchi MUSCULOSKELETAL - Neck: No deformities.  Full range of motion throughout.  Tenderness to palpation along the left cervical paraspinal muscles.  Spurling negative - Left Arm: No deformities.  Full range of motion throughout.  Neurovascular intact distally.  No pain with resisted supraspinatus testing.  Normal internal and external rotation.  Negative Tinel sign at medial epicondyle and wrist.  Pain elicited with forced extension and abduction. Neuro: Grossly normal, moves all extremities Psych: Normal affect and thought content      Lusia Greis M. Jimmey Ralph, MD 03/17/2024 12:05 PM

## 2024-03-17 NOTE — Telephone Encounter (Signed)
 Noted.

## 2024-03-17 NOTE — Patient Instructions (Signed)
 It was very nice to see you today!  I think you probably have a pinched nerve.  Start the prednisone and gabapentin.  Let us know if not proving in the next few days.  Return if symptoms worsen or fail to improve.   Take care, Dr Jimmey Ralph  PLEASE NOTE:  If you had any lab tests, please let us know if you have not heard back within a few days. You may see your results on mychart before we have a chance to review them but we will give you a call once they are reviewed by [place].   If we ordered any referrals today, please let us know if you have not heard from their office within the next week.   If you had any urgent prescriptions sent in today, please check with the pharmacy within an hour of our visit to make sure the prescription was transmitted appropriately.   Please try these tips to maintain a healthy lifestyle:  Eat at least 3 REAL meals and 1-2 snacks per day.  Aim for no more than 5 hours between eating.  If you eat breakfast, please do so within one hour of getting up.   Each meal should contain half fruits/vegetables, one quarter protein, and one quarter carbs (no bigger than a computer mouse)  Cut down on sweet beverages. This includes juice, soda, and sweet tea.   Drink at least 1 glass of water with each meal and aim for at least 8 glasses per day  Exercise at least 150 minutes every week.

## 2024-03-17 NOTE — Telephone Encounter (Signed)
 Copied from CRM [id number]. Topic: Clinical - Red Word Triage >> Mar 17, 2024  7:40 AM [name] wrote: Red Word that prompted transfer to Nurse Triage: Extreme Pain from shoulder to wrist feels really heavy that she has to carry it.    Chief Complaint: Shoulder Pain Symptoms: Numbness, Weakness, Tingling Frequency: Over a week Pertinent Negatives: Patient denies discoloration, swelling,  Disposition: [] ED /[] Urgent Care (no appt availability in office) / [x] Appointment(In office/virtual)/ []  [facility]/ [] Home Care/ [] Refused Recommended Disposition /[] [facility]/ []  Follow-up with PCP Additional Notes: SF is being triaged for shoulder pain lasting over a week. The patient states she has been experiencing pain in her Left Shoulder that radiates down to her elbow and wrist. In addition the patient reports the pain intensifies when it radiates downward to the elbow and she has experienced numbness in the wrist and fingers. Denies any recent work, exercise, medication use, or trauma. Scheduled patient per protocol on today in office.     Reason for Disposition  Numbness (i.e., loss of sensation) in hand or fingers  Answer Assessment - Initial Assessment Questions 1. ONSET: "When did the pain start?"     For A week now  2. LOCATION: "Where is the pain located?"     Left  3. PAIN: "How bad is the pain?" (Scale 1-10; or mild, moderate, severe)   - MILD (1-3): doesn't interfere with normal activities   - MODERATE (4-7): interferes with normal activities (e.g., work or school) or awakens from sleep   - SEVERE (8-10): excruciating pain, unable to do any normal activities, unable to move arm at all due to pain     10  4. WORK OR EXERCISE: "Has there been any recent work or exercise that involved this part of the body?"     No, not really  5. CAUSE: "What do you think is causing the shoulder pain?"     Unsure  6. OTHER SYMPTOMS: "Do you have any other symptoms?"  (e.g., neck pain, swelling, rash, fever, numbness, weakness)     Weakness, Numbness, Tingling    Numbnss and Tingling from Elbow to wrist  7. PREGNANCY: "Is there any chance you are pregnant?" "When was your last menstrual period?"     No and No  Protocols used: Shoulder Pain-A-AH

## 2024-04-07 ENCOUNTER — Encounter: Payer: Self-pay | Admitting: Family

## 2024-04-07 ENCOUNTER — Ambulatory Visit (INDEPENDENT_AMBULATORY_CARE_PROVIDER_SITE_OTHER): Admitting: Family

## 2024-04-07 ENCOUNTER — Ambulatory Visit: Payer: Self-pay

## 2024-04-07 VITALS — BP 121/76 | HR 85 | Temp 98.4°F | Ht 64.0 in | Wt 159.2 lb

## 2024-04-07 DIAGNOSIS — G5692 Unspecified mononeuropathy of left upper limb: Secondary | ICD-10-CM | POA: Diagnosis not present

## 2024-04-07 DIAGNOSIS — N6342 Unspecified lump in left breast, subareolar: Secondary | ICD-10-CM | POA: Diagnosis not present

## 2024-04-07 DIAGNOSIS — M79602 Pain in left arm: Secondary | ICD-10-CM

## 2024-04-07 NOTE — Telephone Encounter (Signed)
 Chief Complaint: Exacerbation of pain r/t left shoulder pinched nerve Symptoms: pain from shoulder to wrist, with numbness of three fingers on left hand Frequency: 4 weeks Pertinent Negatives: Patient denies chest pain, jaw pain, back pain, difficulty breathing Disposition: [] ED /[] Urgent Care (no appt availability in office) / [] Appointment(In office/virtual)/ []  [facility]/ [] Home Care/ [] Refused Recommended Disposition /[] [facility]/ [x]  Follow-up with PCP Additional Notes: Patient contacted office to ask for a referral to Ortho for her ongoing left shoulder pinched nerve. Patient was seen in office within the last month about this and diagnosed with pinched nerve. Patient was given prednisone but states she did not take this medication because she has a hx of stroke and does not like the way this medication makes her feel. Patient has been taking advil for the pain and states it helps mildly. Patient also noticed a bone spur on her left elbow. Patient would like a referral to Ortho. Patient advised to see PCP for follow up with 3 days. No availability noted - routing to office for assistance.    Copied from CRM [id number]. Topic: Appointments - Appointment Scheduling >> Apr 07, 2024  8:56 AM Rosaria Common wrote: Patient/patient representative is calling to schedule an appointment. Refer to attachments for appointment information. Pt called to submit a referral request for ortho care, and have also expressed red word pain in left shoulder down tot he wrist with a possible pinched nerve. Warm transfer to nurse. Reason for Disposition  [1] MODERATE pain (e.g., interferes with normal activities) AND [2] present > 3 days  Answer Assessment - Initial Assessment Questions 1. ONSET: "When did the pain start?"     4 weeks yesterday  2. LOCATION: "Where is the pain located?"     Left shoulder down to left wrist 3. PAIN: "How bad is the pain?" (Scale 1-10; or mild, moderate, severe)    - MILD (1-3): doesn't interfere with normal activities   - MODERATE (4-7): interferes with normal activities (e.g., work or school) or awakens from sleep   - SEVERE (8-10): excruciating pain, unable to do any normal activities, unable to move arm at all due to pain     9-10  4. WORK OR EXERCISE: "Has there been any recent work or exercise that involved this part of the body?"     No 5. CAUSE: "What do you think is causing the shoulder pain?"     Pinched nerve 6. OTHER SYMPTOMS: "Do you have any other symptoms?" (e.g., neck pain, swelling, rash, fever, numbness, weakness)     Sometimes arm feels a little weak  Protocols used: Shoulder Pain-A-AH

## 2024-04-07 NOTE — Progress Notes (Signed)
 Patient ID: [name], female    DOB: 12/17/1971, 53 y.o.   MRN: [id number]  Chief Complaint  Patient presents with   Shoulder Pain    Pt c/o left arm, thumb, pointer and middle finger numbness and tingling sensation, Present for 4 weeks, Has tried prednisone and gabapentin which cause palpitations, pt stopped and has been taking ibuprofen.   Discussed the use of AI scribe software for clinical note transcription with the patient, who gave verbal consent to proceed.  History of Present Illness The patient presents with a 4-5 week history of left arm pain, numbness, and tingling. The discomfort originates in the shoulder blade and radiates down to the wrist, with particular pain in the fingers. The patient describes the arm as feeling heavy and needing to be carried. The symptoms are constant and do not change with movement. The patient also reports experiencing tingling and pain in the chest and neck, which she believes is related to the arm symptoms. The patient has not had any imaging done recently. She sees a Land and had as adjustment earlier today which she says usually helps, but then the symptoms return later in day or next day. The symptoms began after the patient lifted a weight over her head and performed multiple repetitions. The patient initially thought the resulting soreness was a sign of a good workout, but the pain and discomfort have not subsided. The patient has tried massage, laser treatment, and rolfing, which provide temporary relief but the symptoms return within 24 hours. The patient has been taking ibuprofen 1-2 times a day for the pain. The patient also reports a lump in her left breast that she believes has grown recently. The lump is tender and the patient feels a heaviness in her nipple. The patient has had a mammogram within the past year and has had previous breast surgery on her right breast.  Assessment & Plan Left upper extremity pain and numbness   Persistent pain, tingling, and numbness from left shoulder blade to wrist, possibly due to nerve compression after lifting weights in the gym or carpal tunnel syndrome. She has not taken prednisone or Gabapentin prescribed at last visit due to side effect concerns. - Refer to sports medicine for evaluation and potential imaging for nerve compression. - Advise her to contact sports medicine if not contacted within a week. - Discuss potential use of wrist brace for symptom relief. - Recommend starting prednisone with modified dosing regimen, taking it in the morning with food, and cook to cut pill in half if concerned about SE. Do not take Ibuprofen or Aleve while taking the prednisone.  Breast lump Tender breast lump with perceived increase in size, palpable lump noted in areola from 6:00-12:00. Previous benign breast surgery on other breast. - Order breast ultrasound to evaluate the lump & diagnostic mammogram if needed.     Subjective:    Outpatient Medications Prior to Visit  Medication Sig Dispense Refill   ferrous sulfate (FER-IN-SOL) 75 (15 Fe) MG/ML SOLN Take by mouth.     MAGNESIUM PO Take by mouth daily.     Multiple Vitamin (MULTIVITAMIN) tablet Take 1 tablet by mouth daily.     Vitamin D-Vitamin K (K2 PLUS D3 PO) Take by mouth daily.     gabapentin (NEURONTIN) 100 MG capsule Take 1 capsule (100 mg total) by mouth at bedtime. (Patient not taking: Reported on 04/07/2024) 30 capsule 3   predniSONE (DELTASONE) 50 MG tablet Take 1 tablet daily for 5 days. (  Patient not taking: Reported on 04/07/2024) 5 tablet 0   No facility-administered medications prior to visit.   Past Medical History:  Diagnosis Date   Irregular heart beat    PVCs, PACs   Stroke (HCC) 07/24/2021   Past Surgical History:  Procedure Laterality Date   BREAST SURGERY     tumor removed   Allergies  Allergen Reactions   Amoxicillin-Pot Clavulanate     Other reaction(s): Other (See  Comments)  Numbness  Tingling  Other Reaction(s): Other (See Comments)  Tingling, Other reaction(s): Other (See Comments), Numbness, Tingling   Levofloxacin Diarrhea   Cefuroxime Axetil Other (See Comments) and Rash    Tingling  Other Reaction(s): Other (See Comments)  Tingling, Tingling      Objective:    Physical Exam Vitals and nursing note reviewed.  Constitutional:      Appearance: Normal appearance.  Cardiovascular:     Rate and Rhythm: Normal rate and regular rhythm.  Pulmonary:     Effort: Pulmonary effort is normal.     Breath sounds: Normal breath sounds.  Chest:  Breasts:    Left: Mass (within areola, solid lump, fixed, below left nipple, from 6:00-12:00) present.  Musculoskeletal:        General: Normal range of motion.  Skin:    General: Skin is warm and dry.  Neurological:     Mental Status: She is alert.  Psychiatric:        Mood and Affect: Mood normal.        Behavior: Behavior normal.    BP 121/76 (BP Location: Left Arm, Patient Position: Sitting, Cuff Size: Large)   Pulse 85   Temp 98.4 F (36.9 C) (Temporal)   Ht 5\' 4"  (1.626 m)   Wt 159 lb 3.2 oz (72.2 kg)   SpO2 96%   BMI 27.33 kg/m  Wt Readings from Last 3 Encounters:  04/07/24 159 lb 3.2 oz (72.2 kg)  03/17/24 159 lb 9.6 oz (72.4 kg)  11/30/23 156 lb 2 oz (70.8 kg)      Versa Gore, NP

## 2024-04-07 NOTE — Telephone Encounter (Signed)
Pt is scheduled for an appt today.  

## 2024-04-09 NOTE — Progress Notes (Unsigned)
    Ben Jackson D.[facility] [address] [place] [address] Phone: [phone]   Assessment and Plan:     There are no diagnoses linked to this encounter.  ***   Pertinent previous records reviewed include ***    Follow Up: ***     Subjective:   I, [name], am serving as a Neurosurgeon for Doctor Ulysees Gander  Chief Complaint: left shoulder pain   HPI:   04/10/2024 Patient is a 53 year old female with left shoulder pain. Patient states  Relevant Historical Information: ***  Additional pertinent review of systems negative.   Current Outpatient Medications:    ferrous sulfate (FER-IN-SOL) 75 (15 Fe) MG/ML SOLN, Take by mouth., Disp: , Rfl:    gabapentin (NEURONTIN) 100 MG capsule, Take 1 capsule (100 mg total) by mouth at bedtime. (Patient not taking: Reported on 04/07/2024), Disp: 30 capsule, Rfl: 3   MAGNESIUM PO, Take by mouth daily., Disp: , Rfl:    Multiple Vitamin (MULTIVITAMIN) tablet, Take 1 tablet by mouth daily., Disp: , Rfl:    predniSONE (DELTASONE) 50 MG tablet, Take 1 tablet daily for 5 days. (Patient not taking: Reported on 04/07/2024), Disp: 5 tablet, Rfl: 0   Vitamin D-Vitamin K (K2 PLUS D3 PO), Take by mouth daily., Disp: , Rfl:    Objective:     There were no vitals filed for this visit.    There is no height or weight on file to calculate BMI.    Physical Exam:    ***   Electronically signed by:  Marshall Skeeter D.[facility] 7:36 AM 04/09/24

## 2024-04-10 ENCOUNTER — Ambulatory Visit (INDEPENDENT_AMBULATORY_CARE_PROVIDER_SITE_OTHER): Admitting: Sports Medicine

## 2024-04-10 ENCOUNTER — Other Ambulatory Visit: Payer: Self-pay | Admitting: Sports Medicine

## 2024-04-10 ENCOUNTER — Telehealth: Payer: Self-pay | Admitting: Sports Medicine

## 2024-04-10 VITALS — BP 124/84 | HR 76 | Ht 64.0 in | Wt 156.0 lb

## 2024-04-10 DIAGNOSIS — M79602 Pain in left arm: Secondary | ICD-10-CM | POA: Diagnosis not present

## 2024-04-10 DIAGNOSIS — M25512 Pain in left shoulder: Secondary | ICD-10-CM | POA: Diagnosis not present

## 2024-04-10 MED ORDER — KETOROLAC TROMETHAMINE 60 MG/2ML IM SOLN
60.0000 mg | Freq: Once | INTRAMUSCULAR | Status: AC
  Administered 2024-04-10: 60 mg via INTRAMUSCULAR

## 2024-04-10 MED ORDER — METHYLPREDNISOLONE ACETATE 80 MG/ML IJ SUSP
80.0000 mg | Freq: Once | INTRAMUSCULAR | Status: AC
  Administered 2024-04-10: 80 mg via INTRAMUSCULAR

## 2024-04-10 MED ORDER — TIZANIDINE HCL 4 MG PO TABS: 4.0000 mg | ORAL_TABLET | ORAL | 0 refills | Status: DC | PRN

## 2024-04-10 MED ORDER — MELOXICAM 15 MG PO TABS: 15.0000 mg | ORAL_TABLET | Freq: Every day | ORAL | 0 refills | Status: DC

## 2024-04-10 NOTE — Patient Instructions (Signed)
 Injections in backside today - Start meloxicam 15 mg daily x2 weeks.  If still having pain after 2 weeks, complete 3rd-week of NSAID. May use remaining NSAID as needed once daily for pain control.  Do not to use additional over-the-counter NSAIDs (ibuprofen, naproxen, Advil, Aleve, etc.) while taking prescription NSAIDs.  May use Tylenol [phone] mg 2 to 3 times a day for breakthrough pain. Gabapentin at night prn TOS exercises See me in 3 weeks

## 2024-04-10 NOTE — Telephone Encounter (Signed)
 Called and spoke with patient. Tizanidine placed

## 2024-04-10 NOTE — Telephone Encounter (Signed)
 Patient called stating that she was here this morning and received a cocktail injection but has not had any improvement.  How long should it take for this to help?  She also said that Dr Cleora Daft mentioned sending in a low dose muscle relaxer but I did not see this listed?  Please advise.

## 2024-04-14 ENCOUNTER — Ambulatory Visit (INDEPENDENT_AMBULATORY_CARE_PROVIDER_SITE_OTHER): Admitting: Sports Medicine

## 2024-04-14 ENCOUNTER — Ambulatory Visit (INDEPENDENT_AMBULATORY_CARE_PROVIDER_SITE_OTHER)

## 2024-04-14 VITALS — HR 88 | Ht 64.0 in | Wt 156.0 lb

## 2024-04-14 DIAGNOSIS — R202 Paresthesia of skin: Secondary | ICD-10-CM | POA: Diagnosis not present

## 2024-04-14 DIAGNOSIS — M25512 Pain in left shoulder: Secondary | ICD-10-CM

## 2024-04-14 DIAGNOSIS — R200 Anesthesia of skin: Secondary | ICD-10-CM | POA: Diagnosis not present

## 2024-04-14 DIAGNOSIS — M79602 Pain in left arm: Secondary | ICD-10-CM | POA: Diagnosis not present

## 2024-04-14 DIAGNOSIS — M25522 Pain in left elbow: Secondary | ICD-10-CM | POA: Diagnosis not present

## 2024-04-14 NOTE — Patient Instructions (Addendum)
-   Start meloxicam  15 mg daily x2 weeks.  If still having pain after 2 weeks, complete 3rd-week of NSAID. May use remaining NSAID as needed once daily for pain control.  Do not to use additional over-the-counter NSAIDs (ibuprofen , naproxen, Advil , Aleve, etc.) while taking prescription NSAIDs.  May use Tylenol [phone] mg 2 to 3 times a day for breakthrough pain. Gabapentin  100 mg 2x daily as needed Work note provided  EMG  left upper extremity  Follow up 3-5 days after EMG to discuss results

## 2024-04-14 NOTE — Progress Notes (Signed)
 [name] D.[facility] [address] [place] [address] Phone: [phone]   Assessment and Plan:    1. Acute pain of left shoulder 2. Left arm pain 3. Numbness and tingling in left arm -Acute, subsequent visit - No improvement in symptoms that includes pain radiating from left upper extremity to left hand, ongoing for 3 to 4 weeks. Pain originally started localized to left shoulder after a workout, and quickly developed into radiating pain down left upper extremity, intense pain through elbow, tingling sensation in first, second, third digits. Physical exam and HPI consistent with nerve injury to thoracic outlet. Suspect strain to this region during workout versus muscle spasms through thoracic outlet leading to neurologic symptoms versus thoracic outlet syndrome  -Patient has had no improvement despite conservative therapy that is included course of prednisone  50 mg daily,  IM injection methylprednisone/Toradol , HEP - X-rays obtained in clinic.  My interpretation: No acute fracture, dislocation, or vertebral collapse.  - start meloxicam  15 mg daily x2 weeks.  If still having pain after 2 weeks, complete 3rd-week of NSAID. May use remaining NSAID as needed once daily for pain control.  Do not to use additional over-the-counter NSAIDs (ibuprofen , naproxen, Advil , Aleve, etc.) while taking prescription NSAIDs.  May use Tylenol [phone] mg 2 to 3 times a day for breakthrough pain.  -May use gabapentin  100 mg twice daily as needed for neurologic pain.  Patient does not like the way gabapentin  makes her feel, so may use at night as needed to decrease pain prior to sleep - We will further evaluate with nerve conduction study to find origin of patient's pain -Work note provided the patient may be out of work for the remainder of this week. - Recommend total rest of left upper extremity using sling for the next 1 week.  Pertinent previous records reviewed  include none  Follow Up: 3 to 5 days after nerve conduction study to review results and discuss treatment plan.  Patient's treatment goals involve feeling improved prior to vacation in 3 weeks   Subjective:   I, [name], am serving as a Neurosurgeon for Doctor Ulysees Gander   Chief Complaint: left shoulder pain    HPI:    04/10/2024 Patient is a 53 year old female with left shoulder pain for past few weeks after doing OH tricep exercise with dumbells. Patient states that the following day she had soreness but pain did not go away. Pain in back of shoulder that radiates to the thumb, 2nd and 3rd. Using IBU prn and rolfing. Patient also has neck and L elbow pain.   04/14/2024 Patient states that her elbow feels like labor pain but the pain is now constant    Relevant Historical Information: None pertinent  Additional pertinent review of systems negative.   Current Outpatient Medications:    ferrous sulfate (FER-IN-SOL) 75 (15 Fe) MG/ML SOLN, Take by mouth., Disp: , Rfl:    gabapentin  (NEURONTIN ) 100 MG capsule, Take 1 capsule (100 mg total) by mouth at bedtime., Disp: 30 capsule, Rfl: 3   MAGNESIUM PO, Take by mouth daily., Disp: , Rfl:    meloxicam  (MOBIC ) 15 MG tablet, Take 1 tablet (15 mg total) by mouth daily., Disp: 30 tablet, Rfl: 0   Multiple Vitamin (MULTIVITAMIN) tablet, Take 1 tablet by mouth daily., Disp: , Rfl:    predniSONE  (DELTASONE ) 50 MG tablet, Take 1 tablet daily for 5 days., Disp: 5 tablet, Rfl: 0   tiZANidine  (ZANAFLEX )  4 MG tablet, Take 1 tablet (4 mg total) by mouth as needed., Disp: 30 tablet, Rfl: 0   Vitamin D -Vitamin K (K2 PLUS D3 PO), Take by mouth daily., Disp: , Rfl:    Objective:     Vitals:   04/14/24 1514  Pulse: 88  SpO2: 97%  Weight: 156 lb (70.8 kg)  Height: 5\' 4"  (1.626 m)      Body mass index is 26.78 kg/m.    Physical Exam:    Gen: Appears well, nad, nontoxic and pleasant Neuro: Constant numbness and tingling sensation through  first 3 digits of left upper extremity, though light sensation intact, strength is 5/5 with df/pf/inv/ev, muscle tone wnl Skin: no suspicious lesion or defmority Psych: A&O, appropriate mood and affect   Left upper extremity/shoulder:  No deformity, swelling or muscle wasting No scapular winging FF 180, abd 180, int 0, ext 90  TTP broadly including deltoid, upper arm, forearm, lateral epicondyle, medial epicondyle, olecranon Neg neer, hawkins, empty can, obriens, crossarm, subscap liftoff, speeds Neg ant drawer, sulcus sign, apprehension Negative Spurling's test bilat FROM of neck, though radicular symptoms worsened by neck flexion No pain with resisted wrist flexion, extension, supination, pronation Negative Adson's  Electronically signed by:  Marshall Skeeter D.[facility] 3:46 PM 04/14/24

## 2024-04-16 ENCOUNTER — Encounter: Payer: Self-pay | Admitting: Sports Medicine

## 2024-05-01 NOTE — Progress Notes (Signed)
 Ben Keiran Sias D.[facility] [address] [place] [address] Phone: [phone]   Assessment and Plan:     1. Thoracic outlet syndrome (Primary) 2. Acute pain of left shoulder 3. Left arm pain 4. Numbness and tingling in left arm 5. Left elbow pain -Subacute, no improvement, complicated, subsequent visit - No improvement in symptoms that include continued pain from thoracic outlet into left shoulder, radiating along left upper extremity, through elbow, and causing tingling sensations in first, second, third digits.  Currently unclear etiology of ongoing symptoms.  Differential includes thoracic outlet syndrome versus cervical disc herniation versus Paget Schroeder versus [place]Turner - EMG was ordered after prior office visit, however neurology office required patient establish with them in clinic prior to performing EMG and their soonest appointment is in September 2025.  Patient was previously established with [facility] neurology and may reach out to them to establish care sooner. - Recommend further evaluation for underlying cause of symptoms with left upper extremity and thoracic outlet ultrasound to rule out blood clot with past medical history of CVA, and cervical spine MRI, thoracic outlet MRI.  Orders placed today and patient to follow-up 1 week after completing workup to review results and discuss treatment plan which could include epidural CSI versus surgery referral versus other based on results    Pertinent previous records reviewed include seeing none  Follow Up: patient to follow-up 1 week after completing workup to review results and discuss treatment plan which could include epidural CSI versus surgery referral versus other based on results     Subjective:   I, [name], am serving as a Neurosurgeon for Doctor Ulysees Gander   Chief Complaint: left shoulder pain    HPI:    04/10/2024 Patient is a 53 year old  female with left shoulder pain for past few weeks after doing OH tricep exercise with dumbells. Patient states that the following day she had soreness but pain did not go away. Pain in back of shoulder that radiates to the thumb, 2nd and 3rd. Using IBU prn and rolfing. Patient also has neck and L elbow pain.    04/14/2024 Patient states that her elbow feels like labor pain but the pain is now constant   05/02/2024 Patient states she is the same     Relevant Historical Information: None pertinent  Additional pertinent review of systems negative.   Current Outpatient Medications:    ferrous sulfate (FER-IN-SOL) 75 (15 Fe) MG/ML SOLN, Take by mouth., Disp: , Rfl:    gabapentin  (NEURONTIN ) 100 MG capsule, Take 1 capsule (100 mg total) by mouth at bedtime., Disp: 30 capsule, Rfl: 3   MAGNESIUM PO, Take by mouth daily., Disp: , Rfl:    meloxicam  (MOBIC ) 15 MG tablet, Take 1 tablet (15 mg total) by mouth daily., Disp: 30 tablet, Rfl: 0   Multiple Vitamin (MULTIVITAMIN) tablet, Take 1 tablet by mouth daily., Disp: , Rfl:    predniSONE  (DELTASONE ) 50 MG tablet, Take 1 tablet daily for 5 days., Disp: 5 tablet, Rfl: 0   tiZANidine  (ZANAFLEX ) 4 MG tablet, Take 1 tablet (4 mg total) by mouth as needed., Disp: 30 tablet, Rfl: 0   Vitamin D -Vitamin K (K2 PLUS D3 PO), Take by mouth daily., Disp: , Rfl:    Objective:     Vitals:   05/02/24 1325  BP: 126/78  Weight: 159 lb (72.1 kg)  Height: 5\' 4"  (1.626 m)      Body mass index  is 27.29 kg/m.    Physical Exam:    Gen: Appears well, nad, nontoxic and pleasant Neuro: Constant numbness and tingling sensation through first 3 digits of left upper extremity, though light sensation intact, strength is 5/5 with df/pf/inv/ev, muscle tone wnl Skin: no suspicious lesion or defmority Psych: A&O, appropriate mood and affect   Left upper extremity/shoulder:  No deformity, swelling or muscle wasting No scapular winging FF 180, abd 180, int 0, ext 90  TTP  broadly including deltoid, upper arm, forearm, lateral epicondyle, medial epicondyle, olecranon Neg neer, hawkins, empty can, obriens, crossarm, subscap liftoff, speeds Neg ant drawer, sulcus sign, apprehension Negative Spurling's test bilat FROM of neck, though radicular symptoms worsened by neck flexion and right sidebending No pain with resisted wrist flexion, extension, supination, pronation Negative Adson's   Electronically signed by:  Marshall Skeeter D[facility] 1:50 PM 05/02/24

## 2024-05-02 ENCOUNTER — Ambulatory Visit (INDEPENDENT_AMBULATORY_CARE_PROVIDER_SITE_OTHER): Admitting: Sports Medicine

## 2024-05-02 VITALS — BP 126/78 | Ht 64.0 in | Wt 159.0 lb

## 2024-05-02 DIAGNOSIS — M25512 Pain in left shoulder: Secondary | ICD-10-CM | POA: Diagnosis not present

## 2024-05-02 DIAGNOSIS — M25522 Pain in left elbow: Secondary | ICD-10-CM

## 2024-05-02 DIAGNOSIS — R200 Anesthesia of skin: Secondary | ICD-10-CM | POA: Diagnosis not present

## 2024-05-02 DIAGNOSIS — M79602 Pain in left arm: Secondary | ICD-10-CM | POA: Diagnosis not present

## 2024-05-02 DIAGNOSIS — G540 Brachial plexus disorders: Secondary | ICD-10-CM

## 2024-05-02 DIAGNOSIS — R202 Paresthesia of skin: Secondary | ICD-10-CM

## 2024-05-02 NOTE — Patient Instructions (Signed)
 US  referral  Be on the look out for a phone call from [facility]

## 2024-05-13 ENCOUNTER — Other Ambulatory Visit: Payer: Self-pay | Admitting: Sports Medicine

## 2024-05-13 ENCOUNTER — Ambulatory Visit: Payer: Self-pay | Admitting: Sports Medicine

## 2024-05-13 ENCOUNTER — Ambulatory Visit (HOSPITAL_COMMUNITY)
Admission: RE | Admit: 2024-05-13 | Discharge: 2024-05-13 | Disposition: A | Discharge status: Home / Self Care | Source: Ambulatory Visit | Attending: Sports Medicine | Admitting: Sports Medicine

## 2024-05-13 ENCOUNTER — Telehealth: Payer: Self-pay

## 2024-05-13 DIAGNOSIS — M25512 Pain in left shoulder: Secondary | ICD-10-CM | POA: Diagnosis present

## 2024-05-13 DIAGNOSIS — R200 Anesthesia of skin: Secondary | ICD-10-CM | POA: Diagnosis present

## 2024-05-13 DIAGNOSIS — M79602 Pain in left arm: Secondary | ICD-10-CM | POA: Insufficient documentation

## 2024-05-13 DIAGNOSIS — M25522 Pain in left elbow: Secondary | ICD-10-CM | POA: Diagnosis present

## 2024-05-13 DIAGNOSIS — R202 Paresthesia of skin: Secondary | ICD-10-CM | POA: Diagnosis present

## 2024-05-13 DIAGNOSIS — G540 Brachial plexus disorders: Secondary | ICD-10-CM

## 2024-05-13 NOTE — Telephone Encounter (Signed)
 Patient called and notified.

## 2024-05-13 NOTE — Telephone Encounter (Signed)
 Order placed

## 2024-05-13 NOTE — Telephone Encounter (Signed)
 Patient called stating that she was supposed to get MRI's and US . Is scheduled for US  but has not hear anything about the MRI's. I do not see any ordered, but I do see where there possibly should be some ordered. Patient was saying neck back and shoulder?  Patient would like a call when they are ordered.

## 2024-05-15 NOTE — Progress Notes (Signed)
 Ben Yelina Sarratt D.[facility] [address] [place] [address] Phone: [phone]   Assessment and Plan:     1. Thoracic outlet syndrome 2. Acute pain of left shoulder 3. Left arm pain 4. Numbness and tingling in left arm -Subacute, no improvement, complicated, subsequent visit - No improvement in symptoms that include continued pain from thoracic outlet into left shoulder, radiating along left upper extremity, through elbow and causing numbness and tingling sensations into first, second, third digits.  Currently unclear etiology of ongoing symptoms.  Differential includes thoracic outlet syndrome versus cervical disc herniation versus [place]-Turner - Ultrasound of upper extremity was unremarkable for DVT.  Cannot rule out Paget Frutoso Jing disease - Imaging thus far has not shown any abnormal anatomy in thoracic region that would lead to thoracic outlet.  We are planning for C-spine and thoracic outlet MRIs scheduled for 05/22/2024.  Will follow-up afterwards to review imaging - EMG was ordered, however neurology is requiring patient to establish care before they will perform EMG with their soonest available appointment in September 2025.  Patient was previously established with [facility] neurology and may reach out to them to establish care sooner - Patient has had no benefit with NSAID course, p.o. prednisone  course, Tylenol, HEP - Patient feels that gabapentin  100 mg has been too potent and she cannot tolerate the side effects.  May decrease to gabapentin  25 to 50 mg up to 3 times daily to decrease neurologic pain.  New prescription sent to pharmacy   Pertinent previous records reviewed include ultrasound left upper extremity 05/13/2024  Follow Up: 1 week after MRIs to review results and discuss treatment plan   Subjective:   I, Moenique Parris, am serving as a Neurosurgeon for Doctor Ulysees Gander   Chief Complaint: left shoulder pain     HPI:    04/10/2024 Patient is a 53 year old female with left shoulder pain for past few weeks after doing OH tricep exercise with dumbells. Patient states that the following day she had soreness but pain did not go away. Pain in back of shoulder that radiates to the thumb, 2nd and 3rd. Using IBU prn and rolfing. Patient also has neck and L elbow pain.    04/14/2024 Patient states that her elbow feels like labor pain but the pain is now constant    05/02/2024 Patient states she is the same   05/16/2024 Patient states she is the same . Gabapentin  100 is a lot for her     Relevant Historical Information: None pertinent Additional pertinent review of systems negative.   Current Outpatient Medications:    ferrous sulfate (FER-IN-SOL) 75 (15 Fe) MG/ML SOLN, Take by mouth., Disp: , Rfl:    gabapentin  25 MG TABS, Take 25 mg by mouth 3 (three) times daily., Disp: 90 tablet, Rfl: 1   MAGNESIUM PO, Take by mouth daily., Disp: , Rfl:    meloxicam  (MOBIC ) 15 MG tablet, Take 1 tablet (15 mg total) by mouth daily., Disp: 30 tablet, Rfl: 0   Multiple Vitamin (MULTIVITAMIN) tablet, Take 1 tablet by mouth daily., Disp: , Rfl:    predniSONE  (DELTASONE ) 50 MG tablet, Take 1 tablet daily for 5 days., Disp: 5 tablet, Rfl: 0   tiZANidine  (ZANAFLEX ) 4 MG tablet, Take 1 tablet (4 mg total) by mouth as needed., Disp: 30 tablet, Rfl: 0   Vitamin D -Vitamin K (K2 PLUS D3 PO), Take by mouth daily., Disp: , Rfl:    Objective:  Vitals:   05/16/24 1344  Pulse: 85  SpO2: 96%  Weight: 159 lb (72.1 kg)  Height: 5\' 4"  (1.626 m)      Body mass index is 27.29 kg/m.    Physical Exam:    Gen: Appears well, nad, nontoxic and pleasant Neuro: Constant numbness and tingling sensation through first 3 digits of left upper extremity, though light sensation intact, strength is 5/5 with df/pf/inv/ev, muscle tone wnl Skin: no suspicious lesion or defmority Psych: A&O, appropriate mood and affect   Left upper  extremity/shoulder:  No deformity, swelling or muscle wasting No scapular winging FF 180, abd 180, int 0, ext 90  TTP broadly including deltoid, upper arm, forearm, lateral epicondyle, medial epicondyle, olecranon Neg neer, hawkins, empty can, obriens, crossarm, subscap liftoff, speeds Neg ant drawer, sulcus sign, apprehension Negative Spurling's test bilat FROM of neck, though radicular symptoms worsened by neck flexion and right sidebending No pain with resisted wrist flexion, extension, supination, pronation Negative Adson's  Electronically signed by:  Marshall Skeeter D.[facility] 1:59 PM 05/16/24

## 2024-05-16 ENCOUNTER — Other Ambulatory Visit: Payer: Self-pay | Admitting: Sports Medicine

## 2024-05-16 ENCOUNTER — Ambulatory Visit (INDEPENDENT_AMBULATORY_CARE_PROVIDER_SITE_OTHER): Admitting: Sports Medicine

## 2024-05-16 ENCOUNTER — Encounter: Payer: Self-pay | Admitting: Sports Medicine

## 2024-05-16 VITALS — HR 85 | Ht 64.0 in | Wt 159.0 lb

## 2024-05-16 DIAGNOSIS — G540 Brachial plexus disorders: Secondary | ICD-10-CM

## 2024-05-16 DIAGNOSIS — M79602 Pain in left arm: Secondary | ICD-10-CM

## 2024-05-16 DIAGNOSIS — M25512 Pain in left shoulder: Secondary | ICD-10-CM

## 2024-05-16 DIAGNOSIS — R200 Anesthesia of skin: Secondary | ICD-10-CM

## 2024-05-16 DIAGNOSIS — R202 Paresthesia of skin: Secondary | ICD-10-CM

## 2024-05-16 MED ORDER — GABAPENTIN 25 MG PO TABS: 25.0000 mg | ORAL_TABLET | Freq: Three times a day (TID) | ORAL | 1 refills | Status: DC

## 2024-05-16 NOTE — Patient Instructions (Signed)
 Follow up 1 week after MRIs 25-50 mg of gabapentin  up to 3x a day

## 2024-05-22 ENCOUNTER — Ambulatory Visit
Admission: RE | Admit: 2024-05-22 | Discharge: 2024-05-22 | Disposition: A | Discharge status: Home / Self Care | Source: Ambulatory Visit | Attending: Sports Medicine | Admitting: Sports Medicine

## 2024-05-22 DIAGNOSIS — M79602 Pain in left arm: Secondary | ICD-10-CM

## 2024-05-22 DIAGNOSIS — R200 Anesthesia of skin: Secondary | ICD-10-CM

## 2024-05-22 DIAGNOSIS — M25512 Pain in left shoulder: Secondary | ICD-10-CM

## 2024-05-22 DIAGNOSIS — G540 Brachial plexus disorders: Secondary | ICD-10-CM

## 2024-05-28 ENCOUNTER — Encounter: Payer: Self-pay | Admitting: Family Medicine

## 2024-05-28 ENCOUNTER — Ambulatory Visit (INDEPENDENT_AMBULATORY_CARE_PROVIDER_SITE_OTHER): Payer: Commercial Managed Care - PPO | Admitting: Family Medicine

## 2024-05-28 VITALS — BP 111/67 | HR 82 | Temp 98.1°F | Resp 16 | Ht 64.0 in | Wt 164.0 lb

## 2024-05-28 DIAGNOSIS — Z8673 Personal history of transient ischemic attack (TIA), and cerebral infarction without residual deficits: Secondary | ICD-10-CM

## 2024-05-28 DIAGNOSIS — Z0000 Encounter for general adult medical examination without abnormal findings: Secondary | ICD-10-CM

## 2024-05-28 DIAGNOSIS — E782 Mixed hyperlipidemia: Secondary | ICD-10-CM

## 2024-05-28 DIAGNOSIS — E538 Deficiency of other specified B group vitamins: Secondary | ICD-10-CM

## 2024-05-28 DIAGNOSIS — E559 Vitamin D deficiency, unspecified: Secondary | ICD-10-CM

## 2024-05-28 DIAGNOSIS — R7303 Prediabetes: Secondary | ICD-10-CM | POA: Diagnosis not present

## 2024-05-28 DIAGNOSIS — D500 Iron deficiency anemia secondary to blood loss (chronic): Secondary | ICD-10-CM

## 2024-05-28 NOTE — Patient Instructions (Addendum)
 It was very nice to see you today!  Tens unit   PLEASE NOTE:  If you had any lab tests please let us  know if you have not heard back within a few days. You may see your results on MyChart before we have a chance to review them but we will give you a call once they are reviewed by us . If we ordered any referrals today, please let us  know if you have not heard from their office within the next week.   Please try these tips to maintain a healthy lifestyle:  Eat most of your calories during the day when you are active. Eliminate processed foods including packaged sweets (pies, cakes, cookies), reduce intake of potatoes, white bread, white pasta, and white rice. Look for whole grain options, oat flour or almond flour.  Each meal should contain half fruits/vegetables, one quarter protein, and one quarter carbs (no bigger than a computer mouse).  Cut down on sweet beverages. This includes juice, soda, and sweet tea. Also watch fruit intake, though this is a healthier sweet option, it still contains natural sugar! Limit to 3 servings daily.  Drink at least 1 glass of water with each meal and aim for at least 8 glasses per day  Exercise at least 150 minutes every week.

## 2024-05-28 NOTE — Progress Notes (Signed)
 Phone [phone]   Subjective:   Patient is a 53 y.o. female presenting for annual physical.    Chief Complaint  Patient presents with   Annual Exam    CPE    Annual-working on exercise but limited to arm.  Working on diet. Discussed the use of AI scribe software for clinical note transcription with the patient, who gave verbal consent to proceed.  History of Present Illness [name] is a 52 year old female who presents for an annual physical exam and ongoing left shoulder pain.  She has been experiencing left shoulder pain for nearly three months. Initially, the pain felt like her 'fighting bone was constantly being hit' and radiated, but it has now become more isolated. It is described as constant and possibly nerve-related, differing from other types of pain she has experienced. The pain significantly impacts her daily life, making it difficult to perform household tasks and affecting her ability to work. She sometimes needs to lay down in her car to calm the shoulder pain and has been found lying on the floor at work due to the discomfort.  She has tried various treatments including chiropractic care, rolfing, massage, and ultrasound, but these have not provided relief. She also uses ice and heat, but recently discovered that ice might be worsening the pain. She underwent two MRIs approximately a week and a half ago, but the results have not yet been reviewed due to a shortage of radiologists.  No major headaches, dizziness, syncope, blurry or double vision, rhinorrhea, congestion, seasonal allergies, chest pains, palpitations, coughing, dyspnea, vomiting, diarrhea, constipation, or urinary difficulties. 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 She has gained weight and is working on diet and exercise, although her physical activity is limited due to her shoulder pain.    See problem oriented charting- ROS- ROS: Gen: no fever,  chills  Skin: no rash, itching ENT: no ear pain, ear drainage, nasal congestion, rhinorrhea, sinus pressure, sore throat Eyes: no blurry vision, double vision Resp: no cough, wheeze,SOB CV: no CP, palpitations, LE edema,  GI: no heartburn, n/v/d/c, abd pain GU: no dysuria, urgency, frequency, hematuria.  Menopause.  Dr. Genia Kettering.  MSK: L shoulder Neuro: no dizziness, headache, weakness, vertigo Psych: no depression, anxiety, insomnia, SI   The following were reviewed and entered/updated in epic: Past Medical History:  Diagnosis Date   Irregular heart beat    PVCs, PACs   Stroke (HCC) 07/24/2021   Patient Active Problem List   Diagnosis Date Noted   Prediabetes 11/27/2023   Mixed hyperlipidemia 11/27/2023   Vitamin D3 deficiency 11/27/2023   B12 deficiency 11/27/2023   History of cardioembolic cerebrovascular accident (CVA) 11/27/2023   COVID-19 08/14/2023   Past Surgical History:  Procedure Laterality Date   BREAST SURGERY Right    tumor removed    Family History  Problem Relation Age of Onset   Hypertension Mother    Hyperlipidemia Mother    Healthy Mother    Stroke Father 16 - 4   Hypertension Father    Hyperlipidemia Father    Healthy Father     Medications- reviewed and updated Current Outpatient Medications  Medication Sig Dispense Refill   ferrous sulfate (FER-IN-SOL) 75 (15 Fe) MG/ML SOLN Take by mouth.     MAGNESIUM PO Take by mouth daily.     Multiple Vitamin (MULTIVITAMIN) tablet Take 1 tablet by mouth daily.     Vitamin D -Vitamin K (K2 PLUS  D3 PO) Take by mouth daily.     No current facility-administered medications for this visit.    Allergies-reviewed and updated Allergies  Allergen Reactions   Amoxicillin-Pot Clavulanate     Other reaction(s): Other (See Comments)  Numbness  Tingling  Other Reaction(s): Other (See Comments)  Tingling, Other reaction(s): Other (See Comments), Numbness, Tingling   Levofloxacin Diarrhea   Cefuroxime  Axetil Other (See Comments) and Rash    Tingling  Other Reaction(s): Other (See Comments)  Tingling, Tingling    Social History   Social History Narrative   Lives with husband   Objective  Objective:  BP 111/67   Pulse 82   Temp 98.1 F (36.7 C) (Temporal)   Resp 16   Ht 5\' 4"  (1.626 m)   Wt 164 lb (74.4 kg)   SpO2 96%   BMI 28.15 kg/m  Physical Exam  Gen: WDWN NAD x pain from L shoulder HEENT: NCAT, conjunctiva not injected, sclera nonicteric TM WNL B, OP moist, no exudates  NECK:  supple, no thyromegaly, no nodes, no carotid bruits CARDIAC: RRR, S1S2+, no murmur. DP 2+B LUNGS: CTAB. No wheezes ABDOMEN:  BS+, soft, NTND, No HSM, no masses EXT:  no edema MSK: no gross abnormalities. MS 5/5 all 4 NEURO: A&O x3.  CN II-XII intact.  PSYCH: normal mood. Good eye contact     Assessment and Plan   Health Maintenance counseling: 1. Anticipatory guidance: Patient counseled regarding regular dental exams q6 months, eye exams,  avoiding smoking and second hand smoke, limiting alcohol to 1 beverage per day, no illicit drugs.   2. Risk factor reduction:  Advised patient of need for regular exercise and diet rich and fruits and vegetables to reduce risk of heart attack and stroke. Exercise- encouraged.  Wt Readings from Last 3 Encounters:  05/28/24 164 lb (74.4 kg)  05/16/24 159 lb (72.1 kg)  05/02/24 159 lb (72.1 kg)   3. Immunizations/screenings/ancillary studies Immunization History  Administered Date(s) Administered   Tdap 11/17/2014   Health Maintenance Due  Topic Date Due   Cervical Cancer Screening (HPV/Pap Cotest)  Never done    4. Cervical cancer screening- utd 5. Breast cancer screening-  mammogram utd 6. Colon cancer screening - utd 7. Skin cancer screening- advised regular sunscreen use. Denies worrisome, changing, or new skin lesions.  8. Birth control/STD check- n/a 9. Osteoporosis screening- utd 10. Smoking associated screening - non  smoker  Wellness examination -     Lipid panel; Future -     Comprehensive metabolic panel with GFR; Future -     CBC with Differential/Platelet; Future -     Hemoglobin A1c; Future -     TSH; Future -     IBC + Ferritin; Future -     Vitamin B12; Future -     VITAMIN D  25 Hydroxy (Vit-D Deficiency, Fractures); Future -     Magnesium; Future -     Vitamin C; Future  Prediabetes  Mixed hyperlipidemia  Vitamin D3 deficiency -     VITAMIN D  25 Hydroxy (Vit-D Deficiency, Fractures); Future  B12 deficiency -     Vitamin B12; Future  History of cardioembolic cerebrovascular accident (CVA)  Iron deficiency anemia due to chronic blood loss -     CBC with Differential/Platelet; Future -     IBC + Ferritin; Future -     Vitamin C; Future   Wellness-anticipatory guidance.  Work on Diet/Exercise  Check CBC,CMP,lipids,TSH, A1C.  F/u 1 yr -declined  immunizations for now d/t L shoulder pain Assessment and Plan Assessment & Plan Chronic left shoulder pain   Chronic left shoulder pain has persisted for three months, described as constant and severe with neuropathic characteristics. MRI results are pending. Previous treatments, including chiropractic care, rolfing, massage, ultrasound, and TENS therapy, have provided limited relief. The pain significantly impacts daily activities and work. Consider purchasing a TENS unit for home use for temporary pain relief. Continue with current orthopedic and chiropractic care.  H/o anemia-check labs  Stroke   History of stroke attributed to COVID-19 rather than hyperlipidemia. No current treatment for hyperlipidemia.  General Health Maintenance   She is due for a tetanus booster in November and has declined the shingles vaccine. Her mammogram is current until December, and the Cologuard test is valid until 2027. Discussed the importance of maintaining a healthy lifestyle, including diet and exercise, despite shoulder pain limitations. Schedule tetanus  booster for November. Consider shingles vaccination in the future. Schedule fasting blood work for Friday, including vitamin C, magnesium, iron, cholesterol, glucose, liver, renal function, anemia, thyroid, A1c, and B12. Encourage maintaining a healthy lifestyle, including diet and exercise. Schedule mammogram in December. Administer flu vaccine during the upcoming season.    Recommended follow up: Return in about 1 year (around 05/28/2025) for annual physical.   soon for fasting labs.  also sch 6 mo for f/u.  Lab/Order associations:return fasting  Ellsworth Haas, MD

## 2024-05-29 NOTE — Progress Notes (Unsigned)
    Ben Jackson D.[facility] [address] [place] [address] Phone: [phone]   Assessment and Plan:     There are no diagnoses linked to this encounter.  ***   Pertinent previous records reviewed include ***    Follow Up: ***     Subjective:   I, [name], am serving as a Neurosurgeon for Doctor Ulysees Gander   Chief Complaint: left shoulder pain    HPI:    04/10/2024 Patient is a 53 year old female with left shoulder pain for past few weeks after doing OH tricep exercise with dumbells. Patient states that the following day she had soreness but pain did not go away. Pain in back of shoulder that radiates to the thumb, 2nd and 3rd. Using IBU prn and rolfing. Patient also has neck and L elbow pain.    04/14/2024 Patient states that her elbow feels like labor pain but the pain is now constant    05/02/2024 Patient states she is the same    05/16/2024 Patient states she is the same . Gabapentin  100 is a lot for her   05/30/2024 Patient states    Relevant Historical Information: None pertinent  Additional pertinent review of systems negative.   Current Outpatient Medications:    ferrous sulfate (FER-IN-SOL) 75 (15 Fe) MG/ML SOLN, Take by mouth., Disp: , Rfl:    MAGNESIUM PO, Take by mouth daily., Disp: , Rfl:    Multiple Vitamin (MULTIVITAMIN) tablet, Take 1 tablet by mouth daily., Disp: , Rfl:    Vitamin D -Vitamin K (K2 PLUS D3 PO), Take by mouth daily., Disp: , Rfl:    Objective:     There were no vitals filed for this visit.    There is no height or weight on file to calculate BMI.    Physical Exam:    ***   Electronically signed by:  Marshall Skeeter D.[facility] 7:48 AM 05/29/24

## 2024-05-30 ENCOUNTER — Other Ambulatory Visit (INDEPENDENT_AMBULATORY_CARE_PROVIDER_SITE_OTHER)

## 2024-05-30 ENCOUNTER — Ambulatory Visit: Admitting: Sports Medicine

## 2024-05-30 VITALS — HR 81 | Ht 64.0 in | Wt 164.0 lb

## 2024-05-30 DIAGNOSIS — E538 Deficiency of other specified B group vitamins: Secondary | ICD-10-CM

## 2024-05-30 DIAGNOSIS — R202 Paresthesia of skin: Secondary | ICD-10-CM

## 2024-05-30 DIAGNOSIS — E559 Vitamin D deficiency, unspecified: Secondary | ICD-10-CM

## 2024-05-30 DIAGNOSIS — M79602 Pain in left arm: Secondary | ICD-10-CM | POA: Diagnosis not present

## 2024-05-30 DIAGNOSIS — D500 Iron deficiency anemia secondary to blood loss (chronic): Secondary | ICD-10-CM

## 2024-05-30 DIAGNOSIS — Z0000 Encounter for general adult medical examination without abnormal findings: Secondary | ICD-10-CM

## 2024-05-30 DIAGNOSIS — R200 Anesthesia of skin: Secondary | ICD-10-CM

## 2024-05-30 DIAGNOSIS — M5030 Other cervical disc degeneration, unspecified cervical region: Secondary | ICD-10-CM

## 2024-05-30 DIAGNOSIS — M5020 Other cervical disc displacement, unspecified cervical region: Secondary | ICD-10-CM | POA: Diagnosis not present

## 2024-05-30 LAB — COMPREHENSIVE METABOLIC PANEL WITH GFR
ALT: 14 U/L (ref 0–35)
AST: 13 U/L (ref 0–37)
Albumin: 4.5 g/dL (ref 3.5–5.2)
Alkaline Phosphatase: 84 U/L (ref 39–117)
BUN: 15 mg/dL (ref 6–23)
CO2: 29 meq/L (ref 19–32)
Calcium: 9.3 mg/dL (ref 8.4–10.5)
Chloride: 101 meq/L (ref 96–112)
Creatinine, Ser: 0.65 mg/dL (ref 0.40–1.20)
GFR: 101.86 mL/min (ref >60.00)
Glucose, Bld: 84 mg/dL (ref 70–99)
Potassium: 3.8 meq/L (ref 3.5–5.1)
Sodium: 137 meq/L (ref 135–145)
Total Bilirubin: 0.5 mg/dL (ref 0.2–1.2)
Total Protein: 7.2 g/dL (ref 6.0–8.3)

## 2024-05-30 LAB — CBC WITH DIFFERENTIAL/PLATELET
Basophils Absolute: 0 10*3/uL (ref 0.0–0.1)
Basophils Relative: 0.4 % (ref 0.0–3.0)
Eosinophils Absolute: 0.1 10*3/uL (ref 0.0–0.7)
Eosinophils Relative: 1.1 % (ref 0.0–5.0)
HCT: 36.7 % (ref 36.0–46.0)
Hemoglobin: 12.3 g/dL (ref 12.0–15.0)
Lymphocytes Relative: 23.9 % (ref 12.0–46.0)
Lymphs Abs: 1.6 10*3/uL (ref 0.7–4.0)
MCHC: 33.4 g/dL (ref 30.0–36.0)
MCV: 84.4 fl (ref 78.0–100.0)
Monocytes Absolute: 0.4 10*3/uL (ref 0.1–1.0)
Monocytes Relative: 5.5 % (ref 3.0–12.0)
Neutro Abs: 4.7 10*3/uL (ref 1.4–7.7)
Neutrophils Relative %: 69.1 % (ref 43.0–77.0)
Platelets: 345 10*3/uL (ref 150.0–400.0)
RBC: 4.35 Mil/uL (ref 3.87–5.11)
RDW: 13.8 % (ref 11.5–15.5)
WBC: 6.8 10*3/uL (ref 4.0–10.5)

## 2024-05-30 LAB — LIPID PANEL
Cholesterol: 232 mg/dL — ABNORMAL HIGH (ref 0–200)
HDL: 78 mg/dL (ref >39.00)
LDL Cholesterol: 131 mg/dL — ABNORMAL HIGH (ref 0–99)
NonHDL: 154.37
Total CHOL/HDL Ratio: 3
Triglycerides: 115 mg/dL (ref 0.0–149.0)
VLDL: 23 mg/dL (ref 0.0–40.0)

## 2024-05-30 LAB — MAGNESIUM: Magnesium: 1.9 mg/dL (ref 1.5–2.5)

## 2024-05-30 LAB — HEMOGLOBIN A1C: Hgb A1c MFr Bld: 5.7 % (ref 4.6–6.5)

## 2024-05-30 NOTE — Patient Instructions (Signed)
 Recommend epidural left C 6-7 Call if you would like us  to put a referral in for that epidural  Follow up  2 weeks after epidural if you choose to proceed

## 2024-06-03 LAB — VITAMIN C: Vitamin C: 0.4 mg/dL (ref 0.3–2.7)

## 2024-06-05 ENCOUNTER — Ambulatory Visit: Payer: Self-pay | Admitting: Family Medicine

## 2024-06-05 LAB — IBC + FERRITIN
Ferritin: 9.4 ng/mL — ABNORMAL LOW (ref 10.0–291.0)
Iron: 111 ug/dL (ref 42–145)
Saturation Ratios: 27 % (ref 20.0–50.0)
TIBC: 411.6 ug/dL (ref 250.0–450.0)
Transferrin: 294 mg/dL (ref 212.0–360.0)

## 2024-06-05 LAB — TSH: TSH: 0.57 u[IU]/mL (ref 0.35–5.50)

## 2024-06-05 LAB — VITAMIN B12: Vitamin B-12: 112 pg/mL — ABNORMAL LOW (ref 211–911)

## 2024-06-05 LAB — VITAMIN D 25 HYDROXY (VIT D DEFICIENCY, FRACTURES): VITD: 12.12 ng/mL — ABNORMAL LOW (ref 30.00–100.00)

## 2024-06-05 NOTE — Progress Notes (Signed)
 Vit D low-does she want to do 50,000iu weekly rx(#12/1), or 3000-5000iu/day otc Vitamin B12 really low-needs to be taking it  Ferritin(iron stores) really low-needs to be taking the iron-is she? Your cholesterol levels are elevated.  Work on low cholesterol and lower carbs/sugars diet and  get exercise to try to lower your cholesterol.  A1C(3 month average of sugars) is elevated.  This is considered PreDiabetes.  Work on diet-decrease sugars and starches and aim for 30 minutes of exercise 5 days/week to prevent progression to diabetes

## 2024-06-10 ENCOUNTER — Other Ambulatory Visit: Payer: Self-pay | Admitting: *Deleted

## 2024-06-10 MED ORDER — VITAMIN D (ERGOCALCIFEROL) 1.25 MG (50000 UNIT) PO CAPS: 50000.0000 [IU] | ORAL_CAPSULE | ORAL | 1 refills | Status: DC

## 2024-09-01 ENCOUNTER — Ambulatory Visit (INDEPENDENT_AMBULATORY_CARE_PROVIDER_SITE_OTHER): Admitting: Diagnostic Neuroimaging

## 2024-09-01 ENCOUNTER — Encounter: Payer: Self-pay | Admitting: Diagnostic Neuroimaging

## 2024-09-01 VITALS — BP 110/78 | HR 70 | Ht 64.0 in | Wt 168.8 lb

## 2024-09-01 DIAGNOSIS — M79602 Pain in left arm: Secondary | ICD-10-CM

## 2024-09-01 DIAGNOSIS — S161XXS Strain of muscle, fascia and tendon at neck level, sequela: Secondary | ICD-10-CM

## 2024-09-01 DIAGNOSIS — E538 Deficiency of other specified B group vitamins: Secondary | ICD-10-CM

## 2024-09-01 DIAGNOSIS — E611 Iron deficiency: Secondary | ICD-10-CM

## 2024-09-01 DIAGNOSIS — G459 Transient cerebral ischemic attack, unspecified: Secondary | ICD-10-CM | POA: Diagnosis not present

## 2024-09-01 DIAGNOSIS — E559 Vitamin D deficiency, unspecified: Secondary | ICD-10-CM | POA: Diagnosis not present

## 2024-09-01 MED ORDER — ASPIRIN 81 MG PO TBEC: 81.0000 mg | DELAYED_RELEASE_TABLET | Freq: Every day | ORAL | Status: AC

## 2024-09-01 MED ORDER — ATORVASTATIN CALCIUM 20 MG PO TABS: 20.0000 mg | ORAL_TABLET | Freq: Every day | ORAL | 2 refills | Status: DC

## 2024-09-01 NOTE — Progress Notes (Addendum)
 [facility]  PATIENT: [name] DOB: 03/04/1971  REFERRING CLINICIAN: Leonce Katz, DO HISTORY FROM: PATIENT  REASON FOR VISIT: NEW CONSULT    HISTORICAL  CHIEF COMPLAINT:  Chief Complaint  Patient presents with   Neurologic Problem    RM 7, Pt alone, here d/t ortho referral for numbness and tingling in LUE. Pt under the impression she is here to f/u for stroke. Seen last in 2022 for TIA.    HISTORY OF PRESENT ILLNESS:   UPDATE (09/01/24, VRP): 53 year old female with left arm pain.   Since last visit, doing well from TIA standpoint. However, she decided to stop aspirin  and statin on her own. LDL now 131. Trying to improve nutrition. Is doing well on exercising (3x per week; daily walking).   However in March 2025, had an overhead exercise injury, leading to left neck, shoulder arm pain and numbness. Now getting multiple treatments via chiro and massage therapy, and doing much better.   PRIOR HPI (10/10/21, VRP): 53 year old female here for evaluation of TIA.  07/23/2021 patient was driving back from vacation in [place] , which was somewhat stressful since multiple family was got COVID, when all of a sudden she felt numbness on her right arm, face and leg.  She was unable to talk.  She was able to pull the car over.  She was with her daughter who helped call 911.  She was taken to local hospital in [place]  and a code stroke was activated.  Patient was given IV tPA.  Symptoms resolved within a few minutes.  Total duration of symptoms was about 1 hour and 15 minutes.  She was admitted to intensive care unit for evaluation.  She still hospital for 5 days and stroke work-up was completed.  She was discharged on aspirin , Plavix and statin.  She was also diagnosed with incidental calcified meningioma.  Since discharge patient went to [facility] neurology for stroke follow-up.  TEE, 14-day cardiac monitor, EKG were performed and unremarkable.  Since discharge  patient has also had some intermittent migratory paresthesias in her face, arms, legs, on either right or left side, lasting few seconds at a time.  She feels increase sensitivity throughout her body with vibration sensation in her throat, cough sensation, palpitations.  She has been under more stress with increased anxiety.  She went to ENT who found no specific cause.  Of note prior to onset of symptoms in July 2022 patient has been having intermittent left sharp pain, lasting 1 hour at a time, associated with seeing kaleidoscope and pixelated vision.  No prior history of migraine.  Following her TIA she had been having some intermittent issues with nausea.  Also having some pressure sensations in her head.   REVIEW OF SYSTEMS: Full 14 system review of systems performed and negative with exception of: as per HPI.  ALLERGIES: Allergies  Allergen Reactions   Amoxicillin-Pot Clavulanate     Other reaction(s): Other (See Comments)  Numbness  Tingling  Other Reaction(s): Other (See Comments)  Tingling, Other reaction(s): Other (See Comments), Numbness, Tingling   Levofloxacin Diarrhea   Cefuroxime Axetil Other (See Comments) and Rash    Tingling  Other Reaction(s): Other (See Comments)  Tingling, Tingling    HOME MEDICATIONS: Outpatient Medications Prior to Visit  Medication Sig Dispense Refill   ferrous sulfate (FER-IN-SOL) 75 (15 Fe) MG/ML SOLN Take by mouth.     MAGNESIUM PO Take by mouth daily.     Multiple Vitamin (MULTIVITAMIN) tablet Take  1 tablet by mouth daily.     Vitamin D , Ergocalciferol , (DRISDOL ) 1.25 MG (50000 UNIT) CAPS capsule Take 1 capsule (50,000 Units total) by mouth every 7 (seven) days. 12 capsule 1   Vitamin D -Vitamin K (K2 PLUS D3 PO) Take by mouth daily.     No facility-administered medications prior to visit.    PAST MEDICAL HISTORY: Past Medical History:  Diagnosis Date   Irregular heart beat    PVCs, PACs   Stroke (HCC) 07/24/2021    PAST  SURGICAL HISTORY: Past Surgical History:  Procedure Laterality Date   BREAST SURGERY Right 1995   tumor removed    FAMILY HISTORY: Family History  Problem Relation Age of Onset   Hypertension Mother    Hyperlipidemia Mother    Healthy Mother    Stroke Father 70 - 43   Hypertension Father    Hyperlipidemia Father    Healthy Father     SOCIAL HISTORY: Social History   Socioeconomic History   Marital status: Married    Spouse name: Olman   Number of children: 1   Years of education: Not on file   Highest education level: Bachelor's degree (e.g., BA, AB, BS)  Occupational History   Occupation: HR  Tobacco Use   Smoking status: Never   Smokeless tobacco: Never  Vaping Use   Vaping status: Never Used  Substance and Sexual Activity   Alcohol use: No   Drug use: Never   Sexual activity: Not on file  Other Topics Concern   Not on file  Social History Narrative   Lives with husband   Social Drivers of Corporate investment banker Strain: Not on file  Food Insecurity: Not on file  Transportation Needs: Not on file  Physical Activity: Not on file  Stress: Not on file  Social Connections: Unknown (06/01/2023)   Received from [facility]   Social Network    Social Network: Not on file  Intimate Partner Violence: Unknown (06/01/2023)   Received from [facility]   HITS    Physically Hurt: Not on file    Insult or Talk Down To: Not on file    Threaten Physical Harm: Not on file    Scream or Curse: Not on file     PHYSICAL EXAM  GENERAL EXAM/CONSTITUTIONAL: Vitals:  Vitals:   09/01/24 0921  BP: 110/78  Pulse: 70  Weight: 168 lb 12.8 oz (76.6 kg)  Height: 5' 4 (1.626 m)   Body mass index is 28.97 kg/m. Wt Readings from Last 3 Encounters:  09/01/24 168 lb 12.8 oz (76.6 kg)  05/30/24 164 lb (74.4 kg)  05/28/24 164 lb (74.4 kg)   Patient is in no distress; well developed, nourished and groomed; neck is supple  CARDIOVASCULAR: Examination of carotid  arteries is normal; no carotid bruits Regular rate and rhythm, no murmurs Examination of peripheral vascular system by observation and palpation is normal  EYES: Ophthalmoscopic exam of optic discs and posterior segments is normal; no papilledema or hemorrhages No results found.  MUSCULOSKELETAL: Gait, strength, tone, movements noted in Neurologic exam below  NEUROLOGIC: MENTAL STATUS:      No data to display         awake, alert, oriented to person, place and time recent and remote memory intact normal attention and concentration language fluent, comprehension intact, naming intact fund of knowledge appropriate  CRANIAL NERVE:  2nd - no papilledema on fundoscopic exam 2nd, 3rd, 4th, 6th - pupils equal and reactive to light,  visual fields full to confrontation, extraocular muscles intact, no nystagmus 5th - facial sensation symmetric 7th - facial strength symmetric 8th - hearing intact 9th - palate elevates symmetrically, uvula midline 11th - shoulder shrug symmetric 12th - tongue protrusion midline  MOTOR:  normal bulk and tone, full strength in the BUE, BLE  SENSORY:  normal and symmetric to light touch, temperature, vibration  COORDINATION:  finger-nose-finger, fine finger movements normal  REFLEXES:  deep tendon reflexes present and symmetric  GAIT/STATION:  narrow based gait     DIAGNOSTIC DATA (LABS, IMAGING, TESTING) - I reviewed patient records, labs, notes, testing and imaging myself where available.  Lab Results  Component Value Date   WBC 6.8 05/30/2024   HGB 12.3 05/30/2024   HCT 36.7 05/30/2024   MCV 84.4 05/30/2024   PLT 345.0 05/30/2024      Component Value Date/Time   NA 137 05/30/2024 1041   K 3.8 05/30/2024 1041   CL 101 05/30/2024 1041   CO2 29 05/30/2024 1041   GLUCOSE 84 05/30/2024 1041   BUN 15 05/30/2024 1041   CREATININE 0.65 05/30/2024 1041   CALCIUM  9.3 05/30/2024 1041   PROT 7.2 05/30/2024 1041   ALBUMIN 4.5  05/30/2024 1041   AST 13 05/30/2024 1041   ALT 14 05/30/2024 1041   ALKPHOS 84 05/30/2024 1041   BILITOT 0.5 05/30/2024 1041   GFRNONAA >90 06/11/2013 1245   GFRAA >90 06/11/2013 1245   Lab Results  Component Value Date   CHOL 232 (H) 05/30/2024   HDL 78.00 05/30/2024   LDLCALC 131 (H) 05/30/2024   LDLDIRECT 153.0 03/21/2023   TRIG 115.0 05/30/2024   CHOLHDL 3 05/30/2024   Lab Results  Component Value Date   HGBA1C 5.7 05/30/2024   Lab Results  Component Value Date   VITAMINB12 112 (L) 05/30/2024   Lab Results  Component Value Date   TSH 0.57 05/30/2024     07/23/21 Outside records reviewed from [facility] hospital --> MRI brain, CTA head and neck, lab work unremarkable.  08/23/22 MRI brain 1.  No structural findings to explain reported seizures and headaches. No acute intracranial abnormality.  2.  Scattered T2 hyperintense white matter lesions are nonspecific, typically attributed to prior trauma/inflammation/demyelination, or chronic ischemia associated with migraine/atherosclerosis.  3.  No evidence of enhancing dural based meningioma. Focal area of susceptibility along the high right parietal lobe likely reflects mineralization, possibly sequelae of remote trauma or infection, and unchanged since prior imaging from 2011.   11/27/23 MRI brain  - No acute or reversible finding. No change since 08/23/2022 and  02/28/2010. Small right convexity dural or leptomeningeal  calcification has not changed and is not likely significant.   05/22/24 MRI cervical spine 1. Multilevel degenerative disc and joint changes as above. 2. At C6-7, left greater than right posterior disc bulge results in moderate left lateral recess stenosis. Mild left neuroforaminal stenosis. 3. At C5-6, mild left lateral recess stenosis and mild left neuroforaminal stenosis.  05/22/24 MRI thoracic Moderate focal right ligamentum flavum hypertrophy at T10-11 contacts the posterior right cord. Borderline mild  right neuroforaminal stenosis at T10-11. No significant central canal stenosis.   ASSESSMENT AND PLAN  53 y.o. year old female here with:   Dx:  1. TIA (transient ischemic attack)   2. B12 deficiency   3. Vitamin D  deficiency   4. Iron deficiency   5. Left arm pain   6. Strain of neck muscle, sequela     PLAN:  LEFT NECK,  SHOULDER, ARM, HAND PAIN / NUMBNESS (since ~Mar/Apr 2025; following overhead strength workout) - likely related to muscular strain + cervical radiculopathy, now with marked improvement with chiropractor treatments (sports body work, massage, electrical stim, ice, laser tx) - monitor for now  Fatigue / B12 deficiency / iron deficiency - recheck labs - continue vitamin replacements  TIA (vs complicated migraine; in setting of COVID infx and stress on vacation in July 2022) -Patient was given IV tPA for possible stroke; ultimately diagnosed with TIA.  In retrospect this could have been either a TIA or a complicated migraine given the prodromal migraine phenomenon she was having leading up to the event in July 2022.  Hyperlipidemia and COVID infection status could have been risk factors for TIA. - restart aspirin  81mg  daily; restart atorvastatin  20mg  daily (check labs) - optimize nutrition, exercise, sleep, stress mgmt  MIGRAINE VISUAL / DIZZINESS PHENOMENON (without HA) - monitor for migraine symptoms   Meds ordered this encounter  Medications   atorvastatin  (LIPITOR) 20 MG tablet    Sig: Take 1 tablet (20 mg total) by mouth daily.    Dispense:  30 tablet    Refill:  2   aspirin  EC 81 MG tablet    Sig: Take 1 tablet (81 mg total) by mouth daily. Swallow whole.   Orders Placed This Encounter  Procedures   Vitamin B12   Anti-parietal antibody   Intrinsic Factor Antibodies   Iron, TIBC and Ferritin Panel   Lipid Panel   Vitamin D , 25-hydroxy   Return for pending if symptoms worsen or fail to improve, pending test results.  I reviewed images,  labs, notes, records myself. I summarized findings and reviewed with patient, for this high risk condition (TIA) requiring high complexity decision making.    EDUARD FABIENE HANLON, MD 09/01/2024, 10:22 AM Certified in Neurology, Neurophysiology and Neuroimaging  [facility] [address] [place], [place] [address] [phone]

## 2024-09-01 NOTE — Addendum Note (Signed)
 Addended by: DELFINO AUGUSTIN BROCKS on: 09/01/2024 10:35 AM   Modules accepted: Orders

## 2024-09-01 NOTE — Patient Instructions (Addendum)
 LEFT NECK, SHOULDER, ARM, HAND PAIN / NUMBNESS (since ~Mar/Apr 2025; following overhead strength workout) - likely related to muscular strain + cervical radiculopathy, now with marked improvement with chiropractor treatments (sports body work, massage, electrical stim, ice, laser tx) - monitor for now  Fatigue / B12 deficiency / iron deficiency - recheck labs - continue vitamin replacements  TIA (vs complicated migraine; in setting of COVID infx and stress on vacation in July 2022) -Patient was given IV tPA for possible stroke; ultimately diagnosed with TIA.  In retrospect this could have been either a TIA or a complicated migraine given the prodromal migraine phenomenon she was having leading up to the event in July 2022.  Hyperlipidemia and COVID infection status could have been risk factors for TIA. - restart aspirin  81mg  daily; restart atorvastatin  20mg  daily (check labs) - optimize nutrition, exercise, sleep, stress mgmt  MIGRAINE VISUAL / DIZZINESS PHENOMENON (without HA) - monitor for migraine symptoms

## 2024-09-02 LAB — IRON AND TIBC
Iron Saturation: 20 % (ref 15–55)
Iron: 58 ug/dL (ref 27–159)
Total Iron Binding Capacity: 297 ug/dL (ref 250–450)
UIBC: 239 ug/dL (ref 131–425)

## 2024-09-02 LAB — LIPID PANEL
Chol/HDL Ratio: 4.2 ratio (ref 0.0–4.4)
Cholesterol, Total: 233 mg/dL — ABNORMAL HIGH (ref 100–199)
HDL: 56 mg/dL (ref >39)
LDL Chol Calc (NIH): 143 mg/dL — ABNORMAL HIGH (ref 0–99)
Triglycerides: 192 mg/dL — ABNORMAL HIGH (ref 0–149)
VLDL Cholesterol Cal: 34 mg/dL (ref 5–40)

## 2024-09-02 LAB — VITAMIN D 25 HYDROXY (VIT D DEFICIENCY, FRACTURES): Vit D, 25-Hydroxy: 36.3 ng/mL (ref 30.0–100.0)

## 2024-09-02 LAB — ANTI-PARIETAL ANTIBODY: Parietal Cell Ab: 129.8 U — ABNORMAL HIGH (ref 0.0–20.0)

## 2024-09-02 LAB — INTRINSIC FACTOR ANTIBODIES: Intrinsic Factor Abs, Serum: 252.2 [AU]/ml — ABNORMAL HIGH (ref 0.0–1.1)

## 2024-09-02 LAB — VITAMIN B12: Vitamin B-12: 539 pg/mL (ref 232–1245)

## 2024-09-03 ENCOUNTER — Telehealth: Payer: Self-pay | Admitting: Diagnostic Neuroimaging

## 2024-09-03 NOTE — Telephone Encounter (Signed)
 Spoke w/Pt regarding atorvastatin . Pt states she starting taking yesterday morning and began having muscle spasms and a slight HA and happened again today after taking. Discussed w/Pt that statins work better when taken in the evening and to try taking at dinnertime or between dinner and bedtime and make us  aware if still having issues with muscle spasms and HA. Pt voiced understanding. Pt asked about labs. Informed that provider has not given result notes as of this time but will likely soon and either he will reach out or give notes for us  to reach out to her about labs. Pt asked about adding on Hgb A1C. Discussed provider ordered specific labs based on the visit and she can contact her PCP regarding repeat Hgb A1C. Pt voiced understanding.

## 2024-09-03 NOTE — Telephone Encounter (Signed)
 Pt called stating that  Pt had started medication atorvastatin  (LIPITOR) 20 MG tablet  Yesterday and Pt believes that  medication might be giving her some side effect . PT states she is having  muscle spasm and headaches . Pt states the only new medication she started was atorvastatin  (LIPITOR) 20 MG tablet  she has not been taking anything else .

## 2024-09-11 ENCOUNTER — Telehealth: Payer: Self-pay | Admitting: Diagnostic Neuroimaging

## 2024-09-11 ENCOUNTER — Encounter: Payer: Self-pay | Admitting: Diagnostic Neuroimaging

## 2024-09-11 NOTE — Telephone Encounter (Signed)
 Pt is asking for a call with an explanation of lab work results, she states it has been about 2 weeks.

## 2024-09-12 NOTE — Progress Notes (Signed)
 Assessment 1. Positive ANA (antinuclear antibody)  Complement C4 Level   Comprehensive Metabolic Panel   Urinalysis with Reflex to Microscopic   Complement C3 Level   dsDNA Antibody, IFA with Reflex to Titer   CBC with Differential   TSH   CANCELED: CBC with Differential    2. Personal history of transient ischemic attack (TIA), and cerebral infarction without residual deficits  Complement C4 Level   Comprehensive Metabolic Panel   Urinalysis with Reflex to Microscopic   Complement C3 Level   dsDNA Antibody, IFA with Reflex to Titer   CBC with Differential   TSH   CANCELED: CBC with Differential    3. Melasma        Plan/Orders Patient with positive ANA and history of ?TIA. TIA (vs complicated migraine; in setting of COVID infx and stress on vacation in July 2022) -Patient was given IV tPA for possible stroke; ultimately diagnosed with TIA. In retrospect this could have been either a TIA or a complicated migraine given the prodromal migraine phenomenon she was having leading up to the event in July 2022. Hyperlipidemia and COVID infection status could have been risk factors for TIA. No evidence of any inflammatory arthritis, rashes, pleurisy, blood clots, or leukopenia.  APS labs negative.  Recommend continued observation.  Follow up with neurology about labs results.  Return in about 6 months (around 03/12/2025).  Chief Complaint [name] is here for follow up positive ANA.   HPI Follow up positive ANA.  She saw neurology recently and had low iron levels and low vit d. She is on Fe supplement, vit D, and vit b12. Neurology recommended restarting ASA and statin.  She has questions about the labs from neurology.   History of transient ischemic attack/CVA.   Patient experienced right-sided weakness while she was returning from  [place]  in July 2022.   She was seen and evaluated at [facility] in [place], [place] .   She was diagnosed with a  COVID-19 infection and MRI scan of the brain and a CT scan of the head showed no evidence of an acute infarction or bleeding.   She was treated with thrombolytic therapy and was started on aspirin , Plavix and Lipitor.   She has made 100% recovery.   She has established care with neurology and cardiology in [facility], [place] .   As per her medical records there is no evidence of sustained atrial arrhythmias including atrial fibrillation.   A transthoracic echocardiogram and a transesophageal echocardiogram showed no significant abnormalities other than trace mitral regurgitation which was not felt to be pathological.   She has undergone an EEG which shows no epileptiform discharges or EEG seizures.   The MRI scan of the brain repeated on 10/27/2021 shows no space-occupying lesions or infarction.  T here is a 5 mm calcified meningioma and a frontal arachnoid cyst which are stable.  The patient has no history of any other arterial events.   She denies any history of venous thromboembolism.   She denies any history of miscarriages.   She has seen hematology and anti phospholipid labs were negative. +Malar rash melasma. No photosensitivity rashes  No Raynaud's symptoms  No mucosal ulcers No mouth or eye dryness No hx of pleurisy or pericarditis  No history of DVT, MI, or strokes.  No history of hematuria or nephritis  No hot swollen joints.   Vital Signs BP 115/72 (BP Location: Left arm, Patient Position: Sitting)   Pulse 68   Ht  1.626 m (5' 4)   Wt 76.1 kg (167 lb 12.8 oz)   SpO2 98%   BMI 28.80 kg/m  Physical Exam Pt is alert and oriented x 3. Answers questions appropriately.  Heart: normal S1, S2. No S3, no murmur, no thrills. No JVD. Lungs: no dyspnea observed, normal breath sounds on auscultation.  Extremities: no cyanosis, no edema, no calves tenderness. Skin: warm, dry. Melasma on face. No hot swollen joints. Strength 5/5 UE/LE  PMH Medical  History[1]  PSH Surgical History[2]  Allergies Amoxicillin-pot clavulanate, Levofloxacin, and Cefuroxime axetil  Medications Medications Ordered Prior to Encounter[3]   Family Hx Family History[4]  Social Hx  Social History[5]       [1] Past Medical History: Diagnosis Date  . Allergy   . Cyst, kidney, acquired   . TIA (transient ischemic attack) 2022  [2] Past Surgical History: Procedure Laterality Date  . BREAST BIOPSY Left    Procedure: BREAST BIOPSY; in her 30's  . BREAST EXCISIONAL BIOPSY Right    Procedure: BREAST EXCISIONAL BIOPSY; age 77, benign  . BREAST EXCISIONAL BIOPSY Right    Procedure: BREAST EXCISIONAL BIOPSY; age 66's, benign  . CYST REMOVAL     Procedure: CYST REMOVAL  [3] Current Outpatient Medications on File Prior to Visit  Medication Sig Dispense Refill  . ferrous sulfate, as mg of FE, (FER-IN-SOL) 15 mg iron/mL drops Take by mouth daily.    . multivitamin (THERAGRAN) tab tablet Take 1 tablet by mouth daily.    . rimegepant (Nurtec ODT) 75 mg disintegrating tablet Take 1 tablet at onset of complicated migraine symptoms, may repeat if needed in 24 hours 8 tablet 6   No current facility-administered medications on file prior to visit.  [4] Family History Problem Relation Name Age of Onset  . Allergies Mother    . Allergies Father    . Allergies Sister    . Glaucoma Sister    . Glaucoma Paternal Aunt    . Breast cancer Paternal Aunt  59  . Breast cancer Paternal Grandmother  71  . Macular degeneration Neg Hx    . Psoriasis Neg Hx    . Ankylosing spondylitis Neg Hx    . Lupus Neg Hx    . Rheum arthritis Neg Hx    . Gout Neg Hx    [5] Social History Tobacco Use  . Smoking status: Never  . Smokeless tobacco: Never  Substance Use Topics  . Alcohol use: No  . Drug use: No

## 2024-09-16 ENCOUNTER — Ambulatory Visit: Payer: Self-pay | Admitting: Diagnostic Neuroimaging

## 2024-09-19 ENCOUNTER — Encounter: Payer: Self-pay | Admitting: Family Medicine

## 2024-09-19 ENCOUNTER — Ambulatory Visit (INDEPENDENT_AMBULATORY_CARE_PROVIDER_SITE_OTHER): Admitting: Family Medicine

## 2024-09-19 VITALS — BP 100/60 | HR 92 | Temp 97.9°F | Ht 64.0 in | Wt 166.0 lb

## 2024-09-19 DIAGNOSIS — G43109 Migraine with aura, not intractable, without status migrainosus: Secondary | ICD-10-CM | POA: Diagnosis not present

## 2024-09-19 DIAGNOSIS — E782 Mixed hyperlipidemia: Secondary | ICD-10-CM

## 2024-09-19 DIAGNOSIS — E559 Vitamin D deficiency, unspecified: Secondary | ICD-10-CM

## 2024-09-19 DIAGNOSIS — R7303 Prediabetes: Secondary | ICD-10-CM | POA: Diagnosis not present

## 2024-09-19 DIAGNOSIS — D510 Vitamin B12 deficiency anemia due to intrinsic factor deficiency: Secondary | ICD-10-CM | POA: Diagnosis not present

## 2024-09-19 DIAGNOSIS — Z8673 Personal history of transient ischemic attack (TIA), and cerebral infarction without residual deficits: Secondary | ICD-10-CM

## 2024-09-19 DIAGNOSIS — E538 Deficiency of other specified B group vitamins: Secondary | ICD-10-CM

## 2024-09-19 LAB — POCT GLYCOSYLATED HEMOGLOBIN (HGB A1C): Hemoglobin A1C: 5.6 % (ref 4.0–5.6)

## 2024-09-19 MED ORDER — ROSUVASTATIN CALCIUM 5 MG PO TABS: 5.0000 mg | ORAL_TABLET | Freq: Every day | ORAL | 2 refills | Status: DC

## 2024-09-19 MED ORDER — CYANOCOBALAMIN 1000 MCG/ML IJ SOLN
1000.0000 ug | Freq: Once | INTRAMUSCULAR | Status: AC
  Administered 2024-09-19: 1000 ug via INTRAMUSCULAR

## 2024-09-19 NOTE — Patient Instructions (Addendum)
 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  Then add the rosuvastain.   If problems, stop and let me know.   Then we go to plan B

## 2024-09-19 NOTE — Progress Notes (Signed)
 Subjective:     Patient ID: [name], female    DOB: [birth year], 53 y.o.   MRN: [id number]  Chief Complaint  Patient presents with   Medication Problem    Cholesterol; started it about 2wks ago after seeing nuerologist and its been terrible; headache, muscle spasms and leg trimmers    HPI Discussed the use of AI scribe software for clinical note transcription with the patient, who gave verbal consent to proceed.  History of Present Illness [name] is a 53 year old female with a history of stroke who presents for discussion on cholesterol meds  She experiences visual disturbances characterized by seeing 'pixels' and double vision, particularly under fluorescent lighting. Episodes involve blurry vision and double images, such as a face appearing 'cut in half.' These symptoms, triggered by fluorescent lights, have been occurring for several years, initially noticed in an office setting. No headaches accompany these episodes. The visual disturbances typically resolve within 45 minutes. Having one right now, but resolving w/lights out  She has a history of stroke and is currently taking aspirin  daily. She was previously on atorvastatin  for high cholesterol but discontinued it due to side effects, including 'pressure in the head,' tremors, muscle pain, and a sensation of head swelling. These symptoms were similar to those experienced post-stroke. She attempted to restart atorvastatin  but stopped after three days due to the recurrence of these symptoms. Recent blood work indicated high cholesterol levels. Saw neuro and referred back to me to discuss cholesterol meds.  Pt trying to work on diet/exercise  She has a history of prediabetes, with her last A1c test showing a level of 5.6, which is at the upper limit of normal. She has experienced weight fluctuations and is concerned about her sugar levels.  She has been taking vitamin D  supplements, which have improved her levels from a  previous deficiency. Her vitamin B12 levels have also improved, but she has absorption issues. She feels tired daily and experiences 'pins and needles' sensations in her hands, which she attributes to potential B12 deficiency.    Health Maintenance Due  Topic Date Due   COVID-19 Vaccine (1) Never done   Hepatitis B Vaccines 19-59 Average Risk (1 of 3 - 19+ 3-dose series) Never done   Zoster Vaccines- Shingrix (1 of 2) Never done   Cervical Cancer Screening (HPV/Pap Cotest)  Never done   Pneumococcal Vaccine: 50+ Years (1 of 1 - PCV) Never done   Influenza Vaccine  Never done    Past Medical History:  Diagnosis Date   Irregular heart beat    PVCs, PACs   Stroke (HCC) 07/24/2021    Past Surgical History:  Procedure Laterality Date   BREAST SURGERY Right 1995   tumor removed     Current Outpatient Medications:    aspirin  EC 81 MG tablet, Take 1 tablet (81 mg total) by mouth daily. Swallow whole., Disp: , Rfl:    ferrous sulfate (FER-IN-SOL) 75 (15 Fe) MG/ML SOLN, Take by mouth., Disp: , Rfl:    MAGNESIUM PO, Take by mouth daily., Disp: , Rfl:    Multiple Vitamin (MULTIVITAMIN) tablet, Take 1 tablet by mouth daily., Disp: , Rfl:    rosuvastatin  (CRESTOR ) 5 MG tablet, Take 1 tablet (5 mg total) by mouth daily., Disp: 30 tablet, Rfl: 2   Vitamin D , Ergocalciferol , (DRISDOL ) 1.25 MG (50000 UNIT) CAPS capsule, Take 1 capsule (50,000 Units total) by mouth every 7 (seven) days., Disp: 12 capsule, Rfl: 1   Vitamin D -Vitamin  K (K2 PLUS D3 PO), Take by mouth daily., Disp: , Rfl:   Allergies  Allergen Reactions   Amoxicillin-Pot Clavulanate     Other reaction(s): Other (See Comments)  Numbness  Tingling  Other Reaction(s): Other (See Comments)  Tingling, Other reaction(s): Other (See Comments), Numbness, Tingling   Levofloxacin Diarrhea   Cefuroxime Axetil Other (See Comments) and Rash    Tingling  Other Reaction(s): Other (See Comments)  Tingling, Tingling   ROS  neg/noncontributory except as noted HPI/below      Objective:     BP 100/60   Pulse 92   Temp 97.9 F (36.6 C)   Ht 5' 4 (1.626 m)   Wt 166 lb (75.3 kg)   SpO2 98%   BMI 28.49 kg/m  Wt Readings from Last 3 Encounters:  09/19/24 166 lb (75.3 kg)  09/01/24 168 lb 12.8 oz (76.6 kg)  05/30/24 164 lb (74.4 kg)    Physical Exam   Gen: WDWN NAD HEENT: NCAT, conjunctiva not injected, sclera nonicteric EXT:  no edema MSK: no gross abnormalities.  NEURO: A&O x3.  CN II-XII intact.  PSYCH: normal mood. Good eye contact  Reviewed neuro notes/labs  Results for orders placed or performed in visit on 09/19/24  POCT glycosylated hemoglobin (Hb A1C)   Collection Time: 09/19/24  4:58 PM  Result Value Ref Range   Hemoglobin A1C 5.6 4.0 - 5.6 %   HbA1c POC (<> result, manual entry)     HbA1c, POC (prediabetic range)     HbA1c, POC (controlled diabetic range)          Assessment & Plan:  History of cardioembolic cerebrovascular accident (CVA)  Mixed hyperlipidemia  B12 deficiency -     Cyanocobalamin   Prediabetes -     POCT glycosylated hemoglobin (Hb A1C)  Vitamin D3 deficiency  Ocular migraine  Pernicious anemia  Other orders -     Rosuvastatin  Calcium ; Take 1 tablet (5 mg total) by mouth daily.  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 There is no history of migraines with headache, but there is an increased risk for stroke due to migraine auras. Continue daily aspirin  therapy and avoid exposure to fluorescent lights. Consider using sunglasses in environments with fluorescent lighting.  Cerebrovascular accident (stroke)   With a history of stroke, she is currently on aspirin  therapy to reduce the risk of further  cerebrovascular events. Statin therapy is important to prevent further vascular events despite previous intolerance to atorvastatin . Statins are necessary to prevent plaque buildup in the arteries, which can lead to further strokes. Educated pt. Continue daily aspirin  therapy and initiate rosuvastatin  therapy with CoQ10 supplementation to mitigate side effects. Monitor for side effects; if intolerable, discontinue and inform the provider. Consider alternative therapies if rosuvastatin  is not tolerated.  Hypercholesterolemia with statin intolerance   She has hypercholesterolemia with intolerance to atorvastatin , characterized by pressure-like headaches and muscle spasms. Plan to initiate rosuvastatin  therapy with CoQ10 supplementation to mitigate side effects. Statins are the gold standard for reducing plaque and preventing vascular events. If rosuvastatin  is not tolerated, alternative therapies will be considered, but insurance requires failure of two statins before approval of newer, more expensive options. Start CoQ10 100 mg daily for one week, then continue. Initiate rosuvastatin  at a low dose after one week  of CoQ10. Monitor for side effects; if intolerable, discontinue and inform the provider. Send a 30-day prescription of rosuvastatin  to [facility] on [address]. Consider alternative therapies if rosuvastatin  is not tolerated.  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 She has a vitamin B12 deficiency with malabsorption, likely due to a lack of intrinsic factor. Previous improvement in B12 levels with supplementation, but ongoing need for B12 injections due to malabsorption. Administer a B12 injection today and schedule weekly B12 injections for four weeks, then transition to monthly injections. Consider teaching self-administration of B12 injections if desired.  Prediabetes   Her A1c is at 5.6%, indicating prediabetes. Improvement noted with diet and exercise. Emphasis on maintaining  lifestyle modifications to prevent progression to diabetes. Continue diet and exercise regimen to maintain A1c levels. Perform a finger stick A1c test today.    Return for keep appt in Dec.   weekly w/nurse x3 for B12, then monthly.  Jenkins CHRISTELLA Carrel, MD

## 2024-09-21 DIAGNOSIS — D510 Vitamin B12 deficiency anemia due to intrinsic factor deficiency: Secondary | ICD-10-CM | POA: Insufficient documentation

## 2024-09-21 DIAGNOSIS — G43109 Migraine with aura, not intractable, without status migrainosus: Secondary | ICD-10-CM | POA: Insufficient documentation

## 2024-09-22 ENCOUNTER — Encounter: Payer: Self-pay | Admitting: Family Medicine

## 2024-09-22 NOTE — Telephone Encounter (Signed)
 Please see pt msg and advise if any future labs are needed

## 2024-09-25 ENCOUNTER — Ambulatory Visit (INDEPENDENT_AMBULATORY_CARE_PROVIDER_SITE_OTHER)

## 2024-09-25 DIAGNOSIS — E538 Deficiency of other specified B group vitamins: Secondary | ICD-10-CM | POA: Diagnosis not present

## 2024-09-25 MED ORDER — CYANOCOBALAMIN 1000 MCG/ML IJ SOLN
1000.0000 ug | Freq: Once | INTRAMUSCULAR | Status: AC
  Administered 2024-09-25: 1000 ug via INTRAMUSCULAR

## 2024-09-25 NOTE — Progress Notes (Signed)
.  Patient is in office today for a nurse visit for B12 Injection. Patient Injection was given in the  Right deltoid. Patient tolerated injection well. Per Dr. Wendolyn. Pt was advised to schedule next B12 injection

## 2024-10-02 ENCOUNTER — Ambulatory Visit (INDEPENDENT_AMBULATORY_CARE_PROVIDER_SITE_OTHER)

## 2024-10-02 DIAGNOSIS — E538 Deficiency of other specified B group vitamins: Secondary | ICD-10-CM

## 2024-10-02 MED ORDER — CYANOCOBALAMIN 1000 MCG/ML IJ SOLN
1000.0000 ug | Freq: Once | INTRAMUSCULAR | Status: AC
  Administered 2024-10-02: 1000 ug via INTRAMUSCULAR

## 2024-10-02 NOTE — Progress Notes (Signed)
 Patient is in office today for a nurse visit for B12 Injection, per PCP's order. Patient Injection was given in the  Right deltoid. Patient tolerated injection well.

## 2024-10-09 ENCOUNTER — Ambulatory Visit (INDEPENDENT_AMBULATORY_CARE_PROVIDER_SITE_OTHER)

## 2024-10-09 DIAGNOSIS — E538 Deficiency of other specified B group vitamins: Secondary | ICD-10-CM

## 2024-10-09 MED ORDER — CYANOCOBALAMIN 1000 MCG/ML IJ SOLN
1000.0000 ug | Freq: Once | INTRAMUSCULAR | Status: AC
  Administered 2024-10-09: 1000 ug via INTRAMUSCULAR

## 2024-10-09 NOTE — Addendum Note (Signed)
 Addended by: BEVELY CURTISTINE PARAS on: 10/09/2024 01:48 PM   Modules accepted: Orders

## 2024-10-09 NOTE — Progress Notes (Addendum)
 Patient is in office today for a nurse visit for B12 Injection. Patient Injection was given in the  Right deltoid. Patient tolerated injection well. B12 injection four was also scheduled for 10/16/2024 @ 2:15 PM

## 2024-10-16 ENCOUNTER — Ambulatory Visit (INDEPENDENT_AMBULATORY_CARE_PROVIDER_SITE_OTHER)

## 2024-10-16 DIAGNOSIS — E538 Deficiency of other specified B group vitamins: Secondary | ICD-10-CM | POA: Diagnosis not present

## 2024-10-16 MED ORDER — CYANOCOBALAMIN 1000 MCG/ML IJ SOLN
1000.0000 ug | Freq: Once | INTRAMUSCULAR | Status: AC
  Administered 2024-10-16: 1000 ug via INTRAMUSCULAR

## 2024-10-16 NOTE — Progress Notes (Signed)
 Patient is in office today for a nurse visit for B12 Injection. Patient Injection was given in the  Right deltoid. Patient tolerated injection well.

## 2024-11-13 ENCOUNTER — Ambulatory Visit

## 2024-11-13 DIAGNOSIS — E538 Deficiency of other specified B group vitamins: Secondary | ICD-10-CM

## 2024-11-13 MED ORDER — CYANOCOBALAMIN 1000 MCG/ML IJ SOLN
1000.0000 ug | Freq: Once | INTRAMUSCULAR | Status: AC
  Administered 2024-11-13: 1000 ug via INTRAMUSCULAR

## 2024-11-13 NOTE — Progress Notes (Signed)
.  Patient is in office today for a nurse visit for B12 Injection per Dr. Wendolyn. Patient Injection was given in the  Right deltoid. Patient tolerated injection well. Pt was advised to schedule next B12 injection. Pt understood

## 2024-11-20 ENCOUNTER — Other Ambulatory Visit: Payer: Self-pay | Admitting: Family Medicine

## 2024-12-05 ENCOUNTER — Encounter: Payer: Self-pay | Admitting: Family Medicine

## 2024-12-05 ENCOUNTER — Ambulatory Visit: Admitting: Family Medicine

## 2024-12-05 VITALS — BP 134/88 | HR 86 | Temp 97.3°F | Ht 64.0 in | Wt 156.4 lb

## 2024-12-05 DIAGNOSIS — E782 Mixed hyperlipidemia: Secondary | ICD-10-CM

## 2024-12-05 DIAGNOSIS — D500 Iron deficiency anemia secondary to blood loss (chronic): Secondary | ICD-10-CM

## 2024-12-05 DIAGNOSIS — Z8673 Personal history of transient ischemic attack (TIA), and cerebral infarction without residual deficits: Secondary | ICD-10-CM

## 2024-12-05 DIAGNOSIS — E559 Vitamin D deficiency, unspecified: Secondary | ICD-10-CM

## 2024-12-05 DIAGNOSIS — E538 Deficiency of other specified B group vitamins: Secondary | ICD-10-CM

## 2024-12-05 DIAGNOSIS — R252 Cramp and spasm: Secondary | ICD-10-CM

## 2024-12-05 LAB — CBC WITH DIFFERENTIAL/PLATELET
Basophils Absolute: 0 K/uL (ref 0.0–0.1)
Basophils Relative: 0.6 % (ref 0.0–3.0)
Eosinophils Absolute: 0.1 K/uL (ref 0.0–0.7)
Eosinophils Relative: 1.5 % (ref 0.0–5.0)
HCT: 35.8 % — ABNORMAL LOW (ref 36.0–46.0)
Hemoglobin: 12.1 g/dL (ref 12.0–15.0)
Lymphocytes Relative: 34.7 % (ref 12.0–46.0)
Lymphs Abs: 1.7 K/uL (ref 0.7–4.0)
MCHC: 33.8 g/dL (ref 30.0–36.0)
MCV: 84.1 fl (ref 78.0–100.0)
Monocytes Absolute: 0.3 K/uL (ref 0.1–1.0)
Monocytes Relative: 5.4 % (ref 3.0–12.0)
Neutro Abs: 2.8 K/uL (ref 1.4–7.7)
Neutrophils Relative %: 57.8 % (ref 43.0–77.0)
Platelets: 305 K/uL (ref 150.0–400.0)
RBC: 4.25 Mil/uL (ref 3.87–5.11)
RDW: 13 % (ref 11.5–15.5)
WBC: 4.8 K/uL (ref 4.0–10.5)

## 2024-12-05 LAB — LIPID PANEL
Cholesterol: 231 mg/dL — ABNORMAL HIGH (ref 0–200)
HDL: 63.4 mg/dL (ref >39.00)
LDL Cholesterol: 151 mg/dL — ABNORMAL HIGH (ref 0–99)
NonHDL: 167.79
Total CHOL/HDL Ratio: 4
Triglycerides: 85 mg/dL (ref 0.0–149.0)
VLDL: 17 mg/dL (ref 0.0–40.0)

## 2024-12-05 LAB — VITAMIN B12: Vitamin B-12: >1500 pg/mL — ABNORMAL HIGH (ref 211–911)

## 2024-12-05 LAB — COMPREHENSIVE METABOLIC PANEL WITH GFR
ALT: 16 U/L (ref 0–35)
AST: 17 U/L (ref 0–37)
Albumin: 4.7 g/dL (ref 3.5–5.2)
Alkaline Phosphatase: 76 U/L (ref 39–117)
BUN: 11 mg/dL (ref 6–23)
CO2: 29 meq/L (ref 19–32)
Calcium: 9.5 mg/dL (ref 8.4–10.5)
Chloride: 103 meq/L (ref 96–112)
Creatinine, Ser: 0.76 mg/dL (ref 0.40–1.20)
GFR: 90.32 mL/min (ref >60.00)
Glucose, Bld: 82 mg/dL (ref 70–99)
Potassium: 3.7 meq/L (ref 3.5–5.1)
Sodium: 141 meq/L (ref 135–145)
Total Bilirubin: 0.7 mg/dL (ref 0.2–1.2)
Total Protein: 7.6 g/dL (ref 6.0–8.3)

## 2024-12-05 LAB — IBC + FERRITIN
Ferritin: 37.7 ng/mL (ref 10.0–291.0)
Iron: 126 ug/dL (ref 42–145)
Saturation Ratios: 34.5 % (ref 20.0–50.0)
TIBC: 365.4 ug/dL (ref 250.0–450.0)
Transferrin: 261 mg/dL (ref 212.0–360.0)

## 2024-12-05 LAB — VITAMIN D 25 HYDROXY (VIT D DEFICIENCY, FRACTURES): VITD: 42.97 ng/mL (ref 30.00–100.00)

## 2024-12-05 LAB — MAGNESIUM: Magnesium: 2.2 mg/dL (ref 1.5–2.5)

## 2024-12-05 MED ORDER — CYANOCOBALAMIN 1000 MCG/ML IJ SOLN
1000.0000 ug | Freq: Once | INTRAMUSCULAR | Status: AC
  Administered 2024-12-05: 1000 ug via INTRAMUSCULAR

## 2024-12-05 NOTE — Progress Notes (Signed)
 Subjective:     Patient ID: [name], female    DOB: [birth year], 53 y.o.   MRN: [id number]  Chief Complaint  Patient presents with   Hyperlipidemia    Pt is here to go over chronic issues    Discussed the use of AI scribe software for clinical note transcription with the patient, who gave verbal consent to proceed.  History of Present Illness [name] is a 53 year old female who presents for follow-up and management of muscle spasms and high cholesterol.  She feels better overall since her last visit, attributing this improvement to her B12 shots. However, she has been experiencing arm spasms for the past 24 hours, which she associates with the timing of her B12 injections. She is currently on a monthly B12 injection schedule due to a history of absorption issues.  She is managing high cholesterol with rosuvastatin  5 mg daily, which she tolerates well. She previously had issues with atorvastatin .  She mentions her blood sugar levels were high previously and is currently fasting for blood work.  She reports left knee pain, describing a 'potato chip' sound when squatting. She has lost weight.  She notes a new dark spot on her nipple, which she noticed yesterday. It is not itchy. She is due for a mammogram and a dermatology follow-up for melasma.  She continues to take vitamin D3 at a dose of 50,000 units. She reports following a diet and exercising.    Health Maintenance Due  Topic Date Due   Cervical Cancer Screening (HPV/Pap Cotest)  Never done    Past Medical History:  Diagnosis Date   Irregular heart beat    PVCs, PACs   Stroke (HCC) 07/24/2021    Past Surgical History:  Procedure Laterality Date   BREAST SURGERY Right 1995   tumor removed    Current Medications[1]  Allergies[2] ROS neg/noncontributory except as noted HPI/below      Objective:     BP 134/88 (BP Location: Left Arm, Patient Position: Sitting, Cuff Size: Normal)   Pulse 86    Temp (!) 97.3 F (36.3 C) (Temporal)   Ht 5' 4 (1.626 m)   Wt 156 lb 6 oz (70.9 kg)   SpO2 99%   BMI 26.84 kg/m  Wt Readings from Last 3 Encounters:  12/05/24 156 lb 6 oz (70.9 kg)  09/19/24 166 lb (75.3 kg)  09/01/24 168 lb 12.8 oz (76.6 kg)    Physical Exam GENERAL: Well developed, well nourished, no acute distress. HEAD EYES EARS NOSE THROAT: Normocephalic, atraumatic, conjunctiva not injected, sclera nonicteric. CARDIAC: Regular rate and rhythm, S1 S2 present, no murmur, dorsalis pedis 2 plus bilaterally. NECK: Supple, no thyromegaly, no nodes, no carotid bruits. LUNGS: Clear to auscultation bilaterally, no wheezes. ABDOMEN: Bowel sounds present, soft, non-tender, non-distended, no hepatosplenomegaly, no masses. EXTREMITIES: No edema. MUSCULOSKELETAL: No gross abnormalities. NEUROLOGICAL: Alert and oriented x3, cranial nerves II through XII intact. PSYCHIATRIC: Normal mood, good eye contact. SKIN: Dry patch on nipple, possible seborrheic keratosis.       Assessment & Plan:  Mixed hyperlipidemia -     Lipid panel -     Comprehensive metabolic panel with GFR  B12 deficiency -     Vitamin B12  Muscle cramps -     Comprehensive metabolic panel with GFR -     Magnesium -     Zinc  History of cardioembolic cerebrovascular accident (CVA)  Iron deficiency anemia due to chronic blood loss -  CBC with Differential/Platelet -     IBC + Ferritin  Vitamin D3 deficiency -     VITAMIN D  25 Hydroxy (Vit-D Deficiency, Fractures)    Assessment and Plan Assessment & Plan Vitamin B12 deficiency   She reports improvement with B12 injections but experiences muscle spasms, possibly related to B12 levels. She is currently on monthly B12 injections. A B12 level blood test was ordered, and a B12 injection was administered today after the blood test. A dissolvable B complex vitamin is recommended for better absorption.  Mixed hyperlipidemia   She is currently on rosuvastatin   5 mg, with previous intolerance to atorvastatin . Cholesterol levels will be re-evaluated. A cholesterol blood test was ordered. Continue rosuvastatin  5 mg, with consideration to increase the dose based on cholesterol results.  Iron deficiency anemia due to chronic blood loss   She switched from liquid iron to pills due to cost, with no new issues reported.  Vitamin D  deficiency   She is on a vitamin D3 prescription, taking 50,000 units. Vitamin D  levels will be checked, and a vitamin D  level blood test was ordered.  Muscle cramps   She experiences muscle spasms, possibly related to B12 levels. A dissolvable B complex vitamin is recommended for better absorption.  Left knee pain, likely osteoarthritis   She reports left knee pain with crepitus, likely due to osteoarthritis. Weight loss is noted, which may help alleviate symptoms. An X-ray may not be diagnostic for pain. She is referred to sports medicine for further evaluation and recommended to use ibuprofen  or Tylenol as needed for pain.  Seborrheic keratosis   She noticed a dark spot on the nipple, likely seborrheic keratosis, with no itching or concerning features. A moisturizer is recommended for the dry patch, and she is advised to follow up with a dermatologist for further evaluation.  General health maintenance   The importance of diet and exercise was discussed. She is encouraged to continue healthy lifestyle changes, maintaining a healthy diet and exercise regimen.     Return for as sch in June.  Jenkins CHRISTELLA Carrel, MD     [1]  Current Outpatient Medications:    aspirin  EC 81 MG tablet, Take 1 tablet (81 mg total) by mouth daily. Swallow whole., Disp: , Rfl:    ferrous sulfate (FER-IN-SOL) 75 (15 Fe) MG/ML SOLN, Take by mouth., Disp: , Rfl:    MAGNESIUM PO, Take by mouth daily., Disp: , Rfl:    Multiple Vitamin (MULTIVITAMIN) tablet, Take 1 tablet by mouth daily., Disp: , Rfl:    rosuvastatin  (CRESTOR ) 5 MG tablet, Take 1 tablet  (5 mg total) by mouth daily., Disp: 30 tablet, Rfl: 2   Vitamin D , Ergocalciferol , (DRISDOL ) 1.25 MG (50000 UNIT) CAPS capsule, TAKE 1 CAPSULE (50,000 UNITS TOTAL) BY MOUTH EVERY 7 (SEVEN) DAYS, Disp: 12 capsule, Rfl: 1   Vitamin D -Vitamin K (K2 PLUS D3 PO), Take by mouth daily., Disp: , Rfl:  [2]  Allergies Allergen Reactions   Amoxicillin-Pot Clavulanate     Other reaction(s): Other (See Comments)  Numbness  Tingling  Other Reaction(s): Other (See Comments)  Tingling, Other reaction(s): Other (See Comments), Numbness, Tingling   Levofloxacin Diarrhea   Cefuroxime Axetil Other (See Comments) and Rash    Tingling  Other Reaction(s): Other (See Comments)  Tingling, Tingling

## 2024-12-05 NOTE — Patient Instructions (Addendum)
 It was very nice to see you today!  Happy Holidays B complex dissolavable vitamin  [facility] at [facility]  [address] on the 1st floor Phone number [phone]   Seborrheic keratosis   PLEASE NOTE:  If you had any lab tests please let us  know if you have not heard back within a few days. You may see your results on MyChart before we have a chance to review them but we will give you a call once they are reviewed by us . If we ordered any referrals today, please let us  know if you have not heard from their office within the next week.   Please try these tips to maintain a healthy lifestyle:  Eat most of your calories during the day when you are active. Eliminate processed foods including packaged sweets (pies, cakes, cookies), reduce intake of potatoes, white bread, white pasta, and white rice. Look for whole grain options, oat flour or almond flour.  Each meal should contain half fruits/vegetables, one quarter protein, and one quarter carbs (no bigger than a computer mouse).  Cut down on sweet beverages. This includes juice, soda, and sweet tea. Also watch fruit intake, though this is a healthier sweet option, it still contains natural sugar! Limit to 3 servings daily.  Drink at least 1 glass of water with each meal and aim for at least 8 glasses per day  Exercise at least 150 minutes every week.

## 2024-12-06 ENCOUNTER — Ambulatory Visit: Payer: Self-pay | Admitting: Family Medicine

## 2024-12-06 MED ORDER — ROSUVASTATIN CALCIUM 10 MG PO TABS: 10.0000 mg | ORAL_TABLET | Freq: Every day | ORAL | 1 refills | Status: AC

## 2024-12-06 NOTE — Progress Notes (Signed)
 B12 great so continue on current dose of monthly Cholesterol too high, increase rosuvastatin  to 10mg  daily.-new rx sent   Iron better-continue same dose Vitamin D  improved.  Continue the weekly for the rest of the current prescription and then when refill, decrease to every other week

## 2024-12-08 NOTE — Progress Notes (Signed)
 Patient read Kulik's result note via Mychart  Last read by Nena Canes at 7:13PM on 12/06/2024.

## 2024-12-11 ENCOUNTER — Ambulatory Visit

## 2024-12-11 LAB — ZINC: Zinc: 85 ug/dL (ref 60–130)

## 2024-12-12 NOTE — Progress Notes (Signed)
 Called and notified [facility]

## 2024-12-14 ENCOUNTER — Other Ambulatory Visit: Payer: Self-pay | Admitting: Family Medicine

## 2025-01-12 ENCOUNTER — Telehealth: Payer: Self-pay | Admitting: Sports Medicine

## 2025-01-12 ENCOUNTER — Encounter: Payer: Self-pay | Admitting: Sports Medicine

## 2025-01-12 NOTE — Telephone Encounter (Signed)
 Patient called stating that her gym is needing an updated doctors note to excuse her from her membership. She said that they need the date from today, 1/19, to 5/31.  Please advise. (She can get letter from [facility] when ready)

## 2025-01-12 NOTE — Telephone Encounter (Signed)
 Patient states that she is feeling better, but when she coughs, sneezes or works out it causes pain.

## 2025-01-12 NOTE — Telephone Encounter (Signed)
 Note sent

## 2025-01-15 ENCOUNTER — Ambulatory Visit

## 2025-05-29 ENCOUNTER — Encounter: Admitting: Family Medicine
